# Patient Record
Sex: Female | Born: 1945 | Race: Black or African American | Hispanic: No | Smoking: Never smoker
Health system: Southern US, Community
[De-identification: ages and names within clinical notes are randomized; demographics above are authoritative.]

## PROBLEM LIST (undated history)

## (undated) DIAGNOSIS — F0390 Unspecified dementia without behavioral disturbance: Secondary | ICD-10-CM

## (undated) DIAGNOSIS — N179 Acute kidney failure, unspecified: Secondary | ICD-10-CM

## (undated) DIAGNOSIS — F03A0 Unspecified dementia, mild, without behavioral disturbance, psychotic disturbance, mood disturbance, and anxiety: Secondary | ICD-10-CM

## (undated) DIAGNOSIS — D3500 Benign neoplasm of unspecified adrenal gland: Secondary | ICD-10-CM

## (undated) DIAGNOSIS — I10 Essential (primary) hypertension: Secondary | ICD-10-CM

## (undated) DIAGNOSIS — N183 Chronic kidney disease, stage 3 unspecified: Secondary | ICD-10-CM

## (undated) DIAGNOSIS — G459 Transient cerebral ischemic attack, unspecified: Secondary | ICD-10-CM

## (undated) HISTORY — PX: CARPAL TUNNEL RELEASE: SHX101

---

## 2004-09-16 ENCOUNTER — Ambulatory Visit: Payer: Self-pay | Admitting: Specialist

## 2014-06-16 DIAGNOSIS — I10 Essential (primary) hypertension: Secondary | ICD-10-CM | POA: Diagnosis not present

## 2014-06-16 DIAGNOSIS — G459 Transient cerebral ischemic attack, unspecified: Secondary | ICD-10-CM | POA: Diagnosis not present

## 2014-06-16 DIAGNOSIS — I69998 Other sequelae following unspecified cerebrovascular disease: Secondary | ICD-10-CM | POA: Diagnosis not present

## 2014-06-16 DIAGNOSIS — E049 Nontoxic goiter, unspecified: Secondary | ICD-10-CM | POA: Diagnosis not present

## 2014-06-17 DIAGNOSIS — E784 Other hyperlipidemia: Secondary | ICD-10-CM | POA: Diagnosis not present

## 2014-06-17 DIAGNOSIS — I10 Essential (primary) hypertension: Secondary | ICD-10-CM | POA: Diagnosis not present

## 2014-06-26 DIAGNOSIS — E041 Nontoxic single thyroid nodule: Secondary | ICD-10-CM | POA: Diagnosis not present

## 2014-09-24 DIAGNOSIS — G459 Transient cerebral ischemic attack, unspecified: Secondary | ICD-10-CM | POA: Diagnosis not present

## 2014-09-24 DIAGNOSIS — E049 Nontoxic goiter, unspecified: Secondary | ICD-10-CM | POA: Diagnosis not present

## 2015-12-28 DIAGNOSIS — R946 Abnormal results of thyroid function studies: Secondary | ICD-10-CM | POA: Diagnosis not present

## 2015-12-28 DIAGNOSIS — Z1389 Encounter for screening for other disorder: Secondary | ICD-10-CM | POA: Diagnosis not present

## 2015-12-28 DIAGNOSIS — I10 Essential (primary) hypertension: Secondary | ICD-10-CM | POA: Diagnosis not present

## 2017-01-30 DIAGNOSIS — R413 Other amnesia: Secondary | ICD-10-CM | POA: Diagnosis not present

## 2017-01-30 DIAGNOSIS — R4189 Other symptoms and signs involving cognitive functions and awareness: Secondary | ICD-10-CM | POA: Diagnosis not present

## 2017-01-30 DIAGNOSIS — I10 Essential (primary) hypertension: Secondary | ICD-10-CM | POA: Diagnosis not present

## 2017-01-30 DIAGNOSIS — R946 Abnormal results of thyroid function studies: Secondary | ICD-10-CM | POA: Diagnosis not present

## 2017-01-30 DIAGNOSIS — Z1389 Encounter for screening for other disorder: Secondary | ICD-10-CM | POA: Diagnosis not present

## 2017-02-01 ENCOUNTER — Encounter: Payer: Self-pay | Admitting: Emergency Medicine

## 2017-02-01 ENCOUNTER — Emergency Department
Admission: EM | Admit: 2017-02-01 | Discharge: 2017-02-02 | Disposition: A | Discharge status: Home / Self Care | Payer: Medicare Other | Attending: Emergency Medicine | Admitting: Emergency Medicine

## 2017-02-01 DIAGNOSIS — M5410 Radiculopathy, site unspecified: Secondary | ICD-10-CM

## 2017-02-01 DIAGNOSIS — M5412 Radiculopathy, cervical region: Secondary | ICD-10-CM | POA: Diagnosis not present

## 2017-02-01 DIAGNOSIS — M25512 Pain in left shoulder: Secondary | ICD-10-CM | POA: Diagnosis present

## 2017-02-01 LAB — CBC
HCT: 40.4 % (ref 35.0–47.0)
Hemoglobin: 13.5 g/dL (ref 12.0–16.0)
MCH: 29.4 pg (ref 26.0–34.0)
MCHC: 33.6 g/dL (ref 32.0–36.0)
MCV: 87.5 fL (ref 80.0–100.0)
PLATELETS: 320 10*3/uL (ref 150–440)
RBC: 4.62 MIL/uL (ref 3.80–5.20)
RDW: 13.5 % (ref 11.5–14.5)
WBC: 5.5 10*3/uL (ref 3.6–11.0)

## 2017-02-01 LAB — COMPREHENSIVE METABOLIC PANEL
ALT: 11 U/L — ABNORMAL LOW (ref 14–54)
AST: 19 U/L (ref 15–41)
Albumin: 3.9 g/dL (ref 3.5–5.0)
Alkaline Phosphatase: 88 U/L (ref 38–126)
Anion gap: 6 (ref 5–15)
BUN: 15 mg/dL (ref 6–20)
CHLORIDE: 105 mmol/L (ref 101–111)
CO2: 29 mmol/L (ref 22–32)
Calcium: 9.3 mg/dL (ref 8.9–10.3)
Creatinine, Ser: 1.04 mg/dL — ABNORMAL HIGH (ref 0.44–1.00)
GFR, EST NON AFRICAN AMERICAN: 54 mL/min — AB (ref >60)
Glucose, Bld: 141 mg/dL — ABNORMAL HIGH (ref 65–99)
POTASSIUM: 4 mmol/L (ref 3.5–5.1)
Sodium: 140 mmol/L (ref 135–145)
Total Bilirubin: 0.6 mg/dL (ref 0.3–1.2)
Total Protein: 8 g/dL (ref 6.5–8.1)

## 2017-02-01 LAB — TROPONIN I

## 2017-02-01 NOTE — ED Triage Notes (Signed)
Pt in with co left shoulder pain states "all my life". Has never seen md for the same, states no known injury. Pain radiates up left neck and left ear. Worsening symptoms x 3 weeks.

## 2017-02-02 MED ORDER — HYDROCODONE-ACETAMINOPHEN 5-325 MG PO TABS: 1.0000 | ORAL_TABLET | ORAL | 0 refills | Status: DC | PRN

## 2017-02-02 MED ORDER — PREDNISONE 10 MG PO TABS: 10.0000 mg | ORAL_TABLET | Freq: Every day | ORAL | 0 refills | Status: DC

## 2017-02-02 MED ORDER — HYDROCODONE-ACETAMINOPHEN 5-325 MG PO TABS
2.0000 | ORAL_TABLET | Freq: Once | ORAL | Status: AC
  Administered 2017-02-02: 2 via ORAL
  Filled 2017-02-02: qty 2

## 2017-02-02 MED ORDER — PREDNISONE 20 MG PO TABS
60.0000 mg | ORAL_TABLET | Freq: Once | ORAL | Status: AC
  Administered 2017-02-02: 60 mg via ORAL
  Filled 2017-02-02: qty 3

## 2017-02-02 NOTE — ED Provider Notes (Signed)
[facility] Emergency Department Provider Note  Time seen: 12:15 AM  I have reviewed the triage vital signs and the nursing notes.   HISTORY  Chief Complaint Shoulder Pain    HPI Victoria Wilkerson is a 71 y.o. female with no pertinent past medical history presents to the emergency department for left shoulder pain. According to the patient for years she has been scratching left shoulder pain which she describes as a dull pain. Over the past one week intermittently she has been having sharp shooting pain down the left arm. States it is getting more frequent which is what prompted today's emergency department visit. Patient denies any chest pain. Denies any nausea, vomiting, diaphoresis, shortness of breath. Denies any weakness or numbness. Denies any headache. Does state mild neck pain but denies any trauma. Patient states there is no arm pain at times and then she will have significant sharp shooting pain which lasts one or 2 seconds and then dissipates.  No past medical history on file.  There are no active problems to display for this patient.   No past surgical history on file.  Prior to Admission medications   Not on File    No Known Allergies  No family history on file.  Social History Social History  Substance Use Topics  . Smoking status: Not on file  . Smokeless tobacco: Not on file  . Alcohol use Not on file    Review of Systems Constitutional: Negative for fever. Cardiovascular: Negative for chest pain. Respiratory: Negative for shortness of breath. Gastrointestinal: Negative for abdominal pain, vomiting  Musculoskeletal: left shoulder pain/left upper extremity pain Neurological: Negative for headaches, focal weakness or numbness. All other ROS negative  ____________________________________________   PHYSICAL EXAM:  VITAL SIGNS: ED Triage Vitals  Enc Vitals Group     BP 02/01/17 2127 (!) 150/83     Pulse Rate 02/01/17 2127 96   Resp 02/01/17 2127 20     Temp 02/01/17 2127 97.7 F (36.5 C)     Temp Source 02/01/17 2127 Oral     SpO2 02/01/17 2127 97 %     Weight 02/01/17 2128 183 lb (83 kg)     Height 02/01/17 2128 5' (1.524 m)     Head Circumference --      Peak Flow --      Pain Score 02/01/17 2126 9     Pain Loc --      Pain Edu? --      Excl. in [place]? --     Constitutional: Alert and oriented. Well appearing and in no distress. Eyes: Normal exam ENT   Head: Normocephalic and atraumatic   Mouth/Throat: Mucous membranes are moist. Cardiovascular: Normal rate, regular rhythm. No murmur Respiratory: Normal respiratory effort without tachypnea nor retractions. Breath sounds are clear  Gastrointestinal: Soft and nontender. No distention.  Musculoskeletal: nontender left shoulder with good range of motion in the left arm, neurovascular intact. No erythema or edema noted.no C, T or L-spine tenderness.on exam turning the patient's head to her right elicited the sharp pain shooting down her left upper extremity. Neurologic:  Normal speech and language. No gross focal neurologic deficits  Skin:  Skin is warm, dry and intact.  Psychiatric: Mood and affect are normal. Speech and behavior are normal.   ____________________________________________    EKG  EKG reviewed and interpreted by myself shows normal sinus rhythm at 82 bpm, narrow QRS, mild left axis deviation, normal intervals no concerning ST changes.  ____________________________________________  INITIAL IMPRESSION / ASSESSMENT AND PLAN / ED COURSE  Pertinent labs & imaging results that were available during my care of the patient were reviewed by me and considered in my medical decision making (see chart for details).  patient presents to the emergency department for intermittent sharp shooting pain down the left upper extremity. Differential this time would include radicular/neuropathic pain, neuropathy, less likely ACS. Patient has no  tenderness in the left shoulder with good range of motion, do not suspect fracture. Patient's pain was elicited when she turns her head to the right, highly suspect radicular pain/cervical radiculopathy. We will place the patient on a course of prednisone as well as Vicodin for short-term pain relief. Patient will follow-up with orthopedics if not improved in the next several days. Patient and family agreeable to plan. Attempted to review the patient's records and no pertinent medical history was identified.  ____________________________________________   FINAL CLINICAL IMPRESSION(S) / ED DIAGNOSES  cervical radiculopathy    Harvest Dark, MD 02/02/17 0021

## 2017-02-02 NOTE — Discharge Instructions (Signed)
please take your medications as prescribed. Please follow-up with your primary care doctor in the next 2 days for recheck/reevaluation. Return to the emergency department for any worsening pain, any chest pain, any weakness, numbness, or any other symptom personally concerning to yourself. If you're not improved in the next 2-3 days please call the number provided for orthopedics to arrange a follow-up appointment.

## 2017-03-14 DIAGNOSIS — Z9189 Other specified personal risk factors, not elsewhere classified: Secondary | ICD-10-CM | POA: Diagnosis not present

## 2017-03-14 DIAGNOSIS — F0280 Dementia in other diseases classified elsewhere without behavioral disturbance: Secondary | ICD-10-CM | POA: Diagnosis not present

## 2017-03-14 DIAGNOSIS — F0390 Unspecified dementia without behavioral disturbance: Secondary | ICD-10-CM | POA: Diagnosis not present

## 2017-03-14 DIAGNOSIS — R413 Other amnesia: Secondary | ICD-10-CM | POA: Diagnosis not present

## 2017-03-14 DIAGNOSIS — R7309 Other abnormal glucose: Secondary | ICD-10-CM | POA: Diagnosis not present

## 2017-03-14 DIAGNOSIS — G309 Alzheimer's disease, unspecified: Secondary | ICD-10-CM | POA: Diagnosis not present

## 2017-03-21 ENCOUNTER — Other Ambulatory Visit: Payer: Self-pay | Admitting: Neurology

## 2017-03-21 DIAGNOSIS — I639 Cerebral infarction, unspecified: Secondary | ICD-10-CM

## 2017-03-30 ENCOUNTER — Ambulatory Visit
Admission: RE | Admit: 2017-03-30 | Discharge: 2017-03-30 | Disposition: A | Discharge status: Home / Self Care | Payer: Medicare Other | Source: Ambulatory Visit | Attending: Neurology | Admitting: Neurology

## 2017-03-30 DIAGNOSIS — R413 Other amnesia: Secondary | ICD-10-CM | POA: Diagnosis not present

## 2017-03-30 DIAGNOSIS — I639 Cerebral infarction, unspecified: Secondary | ICD-10-CM

## 2017-04-17 DIAGNOSIS — G301 Alzheimer's disease with late onset: Secondary | ICD-10-CM | POA: Diagnosis not present

## 2017-04-17 DIAGNOSIS — F0280 Dementia in other diseases classified elsewhere without behavioral disturbance: Secondary | ICD-10-CM | POA: Diagnosis not present

## 2017-04-17 DIAGNOSIS — G459 Transient cerebral ischemic attack, unspecified: Secondary | ICD-10-CM | POA: Diagnosis not present

## 2018-10-22 IMAGING — MR MR HEAD W/O CM
10 series · 48 of 48 positions shown · non-contrast
Comparison: None.

CLINICAL DATA: Memory loss for 2-3 weeks.  Evaluate for stroke.

EXAM:
MRI HEAD WITHOUT CONTRAST
TECHNIQUE: Multiplanar, multiecho pulse sequences of the brain and surrounding
structures were obtained without intravenous contrast.

[Series 2: T1 · sagittal · 5.0mm · 0.45mm/px · 3 of 23 slices shown (1 of 2)]
[im 1/23]
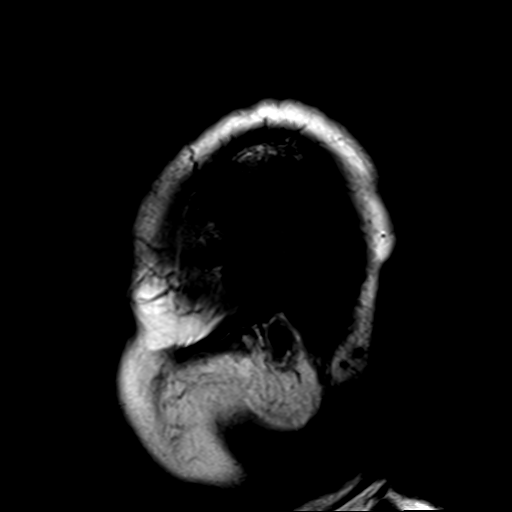
[im 12/23]
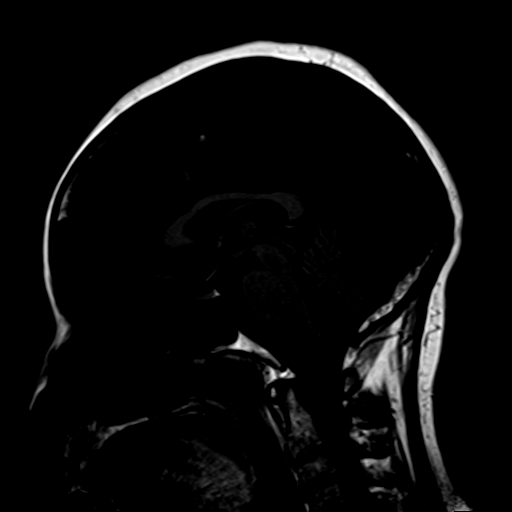
[im 23/23]
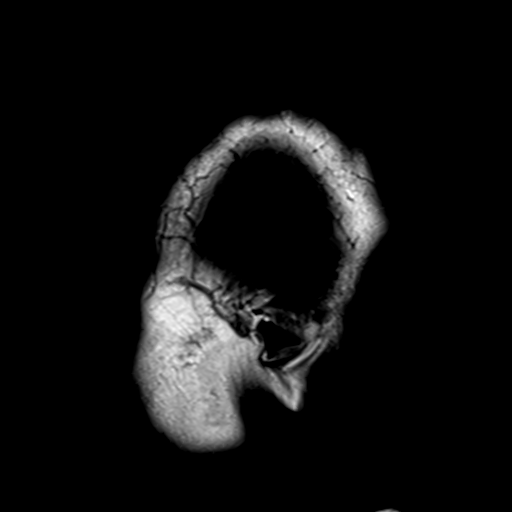

[Series 4: DWI · axial · 3.0mm · 1.80mm/px · z∈[-104,+56]mm · 5 of 55 slices shown (1 of 2)]
[im 1/55]
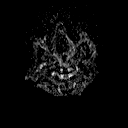
[im 14/55]
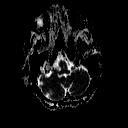
[im 28/55]
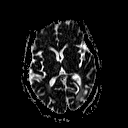
[im 41/55]
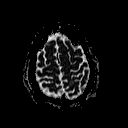
[im 55/55]
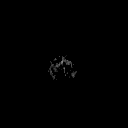

[Series 6: DWI · coronal · 3.0mm · 1.80mm/px · 4 of 45 slices shown (2 of 2)]
[im 1/45]
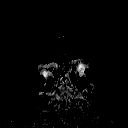
[im 15/45]
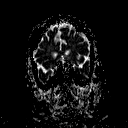
[im 30/45]
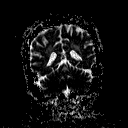
[im 45/45]
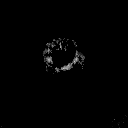

[Series 7: T2 · axial · 5.0mm · 0.60mm/px · z∈[-100,+53]mm · 2 of 25 slices shown (1 of 3)]
[im 1/25]
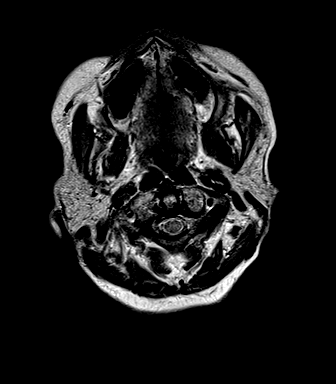
[im 25/25]
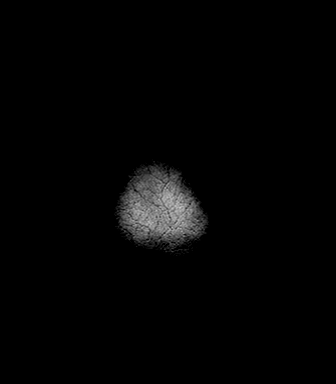

[Series 8: FLAIR · axial · 3.0mm · 0.45mm/px · z∈[-100,+53]mm · 5 of 53 slices shown]
[im 1/53]
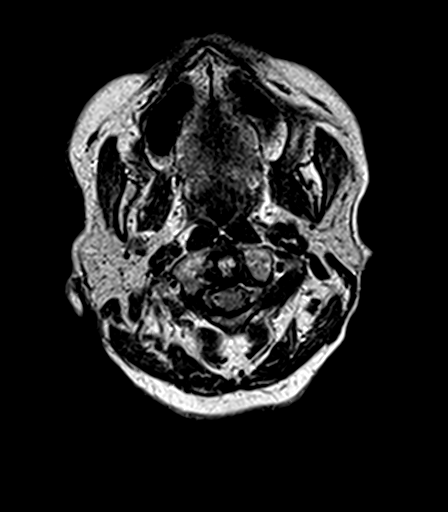
[im 14/53]
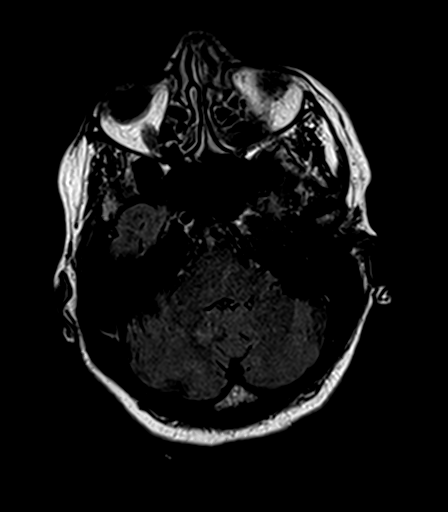
[im 27/53]
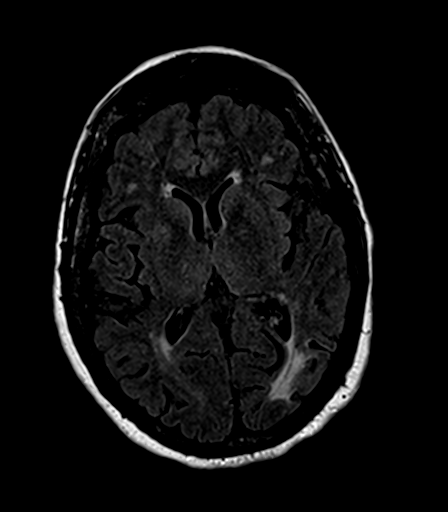
[im 40/53]
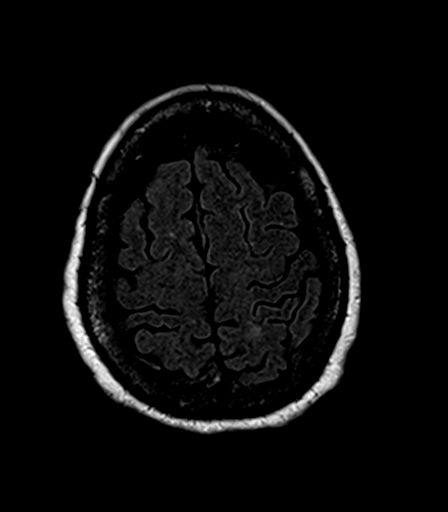
[im 53/53]
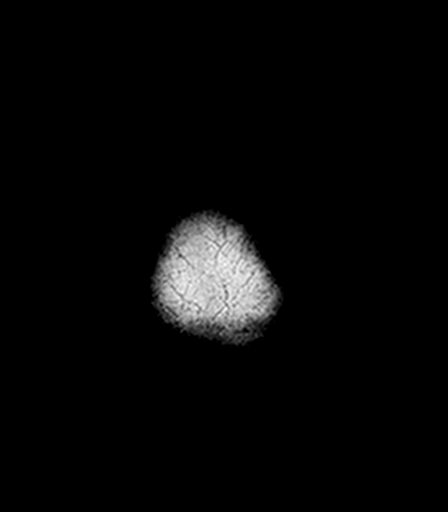

[Series 9: T2 · axial · 5.0mm · 0.45mm/px · z∈[-100,+53]mm · 2 of 25 slices shown (2 of 3)]
[im 1/25]
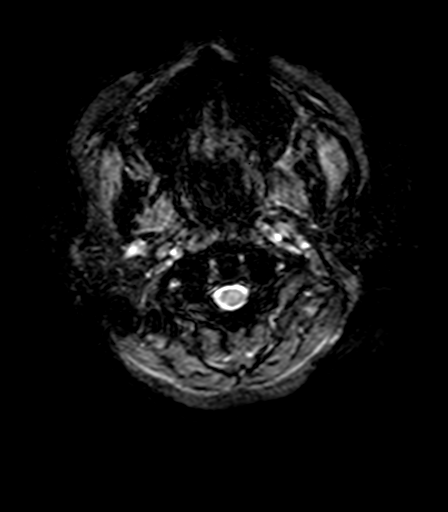
[im 25/25]
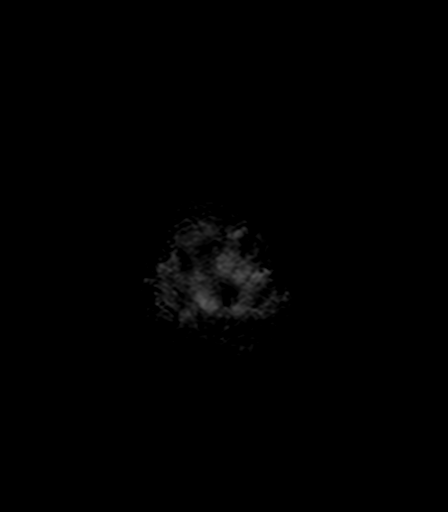

[Series 10: T1 · axial · 1.0mm · 1.00mm/px · z∈[-112,+61]mm · 16 of 176 slices shown (2 of 2)]
[im 1/176]
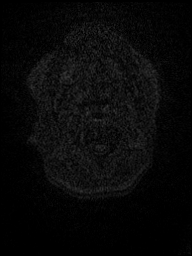
[im 12/176]
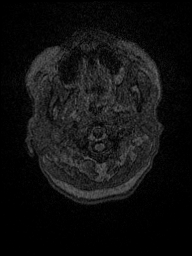
[im 24/176]
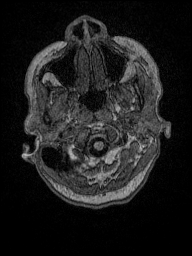
[im 36/176]
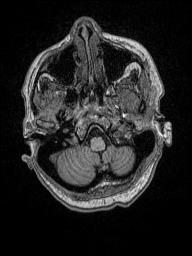
[im 47/176]
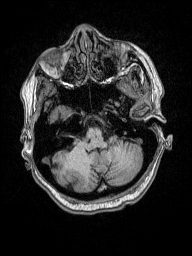
[im 59/176]
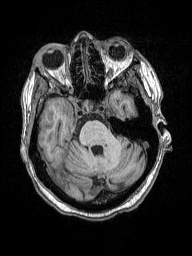
[im 71/176]
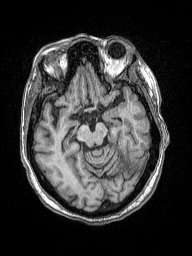
[im 82/176]
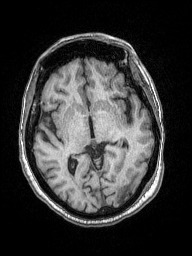
[im 94/176]
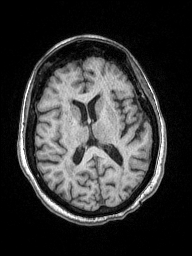
[im 106/176]
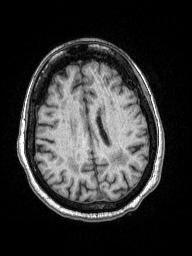
[im 117/176]
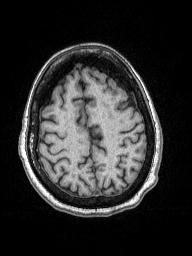
[im 129/176]
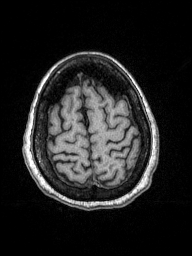
[im 141/176]
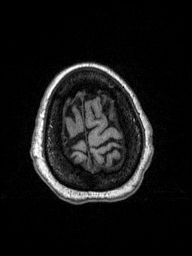
[im 152/176]
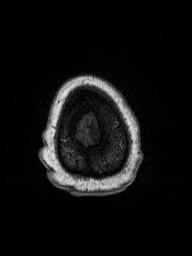
[im 164/176]
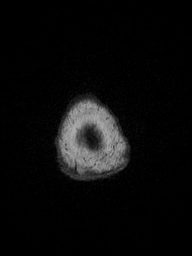
[im 176/176]
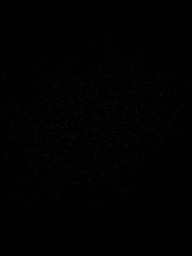

[Series 11: T2 · coronal · 5.0mm · 0.49mm/px · 2 of 27 slices shown (3 of 3)]
[im 1/27]
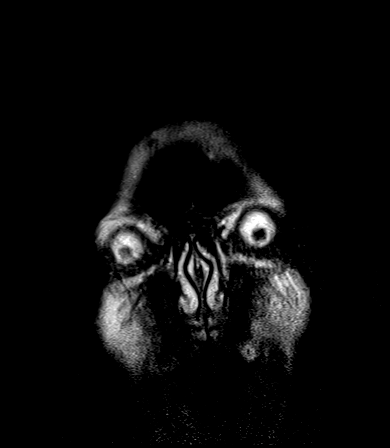
[im 27/27]
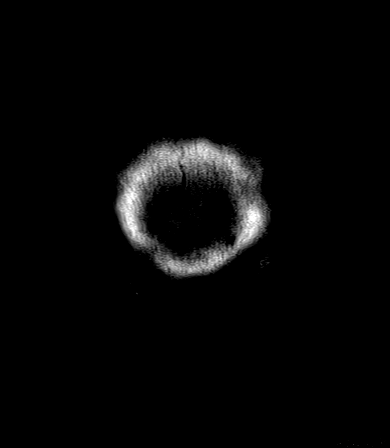

[Series 100: ax (id) · axial · 3.0mm · 1.80mm/px · z∈[-104,+56]mm · 5 of 55 slices shown]
[im 1/55]
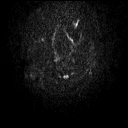
[im 14/55]
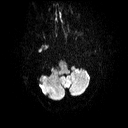
[im 28/55]
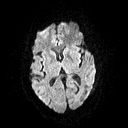
[im 41/55]
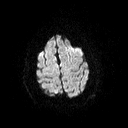
[im 55/55]
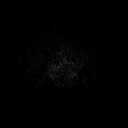

[Series 101: cor (id) · coronal · 3.0mm · 1.80mm/px · 4 of 44 slices shown]
[im 1/44]
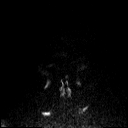
[im 15/44]
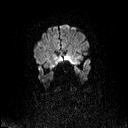
[im 29/44]
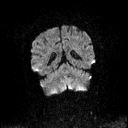
[im 44/44]
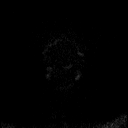

[48 of 48 positions shown; findings below may reference images not displayed]

FINDINGS: Brain: No acute infarction, hemorrhage, hydrocephalus, extra-axial
collection or mass lesion. Small remote left occipital cortex
infarct. Tiny right occipital cortex infarct. Chronic small vessel
ischemic type gliosis in the cerebral white matter. Normal brain
volume.

Vascular: Major flow voids are preserved

Skull and upper cervical spine: Negative for marrow lesion

Sinuses/Orbits: Negative
IMPRESSION: 1. No acute finding including infarct.
2. No specific or reversible explanation for memory loss.
3. Mild to moderate chronic small vessel ischemic change in the
cerebral white matter. Small remote occipital infarcts.

## 2019-09-23 ENCOUNTER — Other Ambulatory Visit: Payer: Self-pay

## 2019-09-23 ENCOUNTER — Emergency Department: Payer: Medicare Other

## 2019-09-23 ENCOUNTER — Inpatient Hospital Stay
Admission: EM | Admit: 2019-09-23 | Discharge: 2019-09-27 | DRG: 871 | Disposition: A | Discharge status: Home Health | Payer: Medicare Other | Attending: Internal Medicine | Admitting: Internal Medicine

## 2019-09-23 DIAGNOSIS — F03A0 Unspecified dementia, mild, without behavioral disturbance, psychotic disturbance, mood disturbance, and anxiety: Secondary | ICD-10-CM | POA: Insufficient documentation

## 2019-09-23 DIAGNOSIS — Z8673 Personal history of transient ischemic attack (TIA), and cerebral infarction without residual deficits: Secondary | ICD-10-CM

## 2019-09-23 DIAGNOSIS — I10 Essential (primary) hypertension: Secondary | ICD-10-CM | POA: Diagnosis present

## 2019-09-23 DIAGNOSIS — A419 Sepsis, unspecified organism: Secondary | ICD-10-CM

## 2019-09-23 DIAGNOSIS — D3500 Benign neoplasm of unspecified adrenal gland: Secondary | ICD-10-CM | POA: Diagnosis present

## 2019-09-23 DIAGNOSIS — I129 Hypertensive chronic kidney disease with stage 1 through stage 4 chronic kidney disease, or unspecified chronic kidney disease: Secondary | ICD-10-CM | POA: Diagnosis present

## 2019-09-23 DIAGNOSIS — N3000 Acute cystitis without hematuria: Secondary | ICD-10-CM | POA: Diagnosis not present

## 2019-09-23 DIAGNOSIS — F03918 Unspecified dementia, unspecified severity, with other behavioral disturbance: Secondary | ICD-10-CM | POA: Insufficient documentation

## 2019-09-23 DIAGNOSIS — G9340 Encephalopathy, unspecified: Secondary | ICD-10-CM

## 2019-09-23 DIAGNOSIS — N1831 Chronic kidney disease, stage 3a: Secondary | ICD-10-CM

## 2019-09-23 DIAGNOSIS — G9349 Other encephalopathy: Secondary | ICD-10-CM | POA: Diagnosis not present

## 2019-09-23 DIAGNOSIS — A4189 Other specified sepsis: Principal | ICD-10-CM | POA: Diagnosis present

## 2019-09-23 DIAGNOSIS — F0390 Unspecified dementia without behavioral disturbance: Secondary | ICD-10-CM | POA: Diagnosis present

## 2019-09-23 DIAGNOSIS — Z23 Encounter for immunization: Secondary | ICD-10-CM

## 2019-09-23 DIAGNOSIS — N390 Urinary tract infection, site not specified: Secondary | ICD-10-CM | POA: Diagnosis present

## 2019-09-23 DIAGNOSIS — E876 Hypokalemia: Secondary | ICD-10-CM | POA: Diagnosis present

## 2019-09-23 DIAGNOSIS — J189 Pneumonia, unspecified organism: Secondary | ICD-10-CM

## 2019-09-23 DIAGNOSIS — U071 COVID-19: Secondary | ICD-10-CM | POA: Diagnosis present

## 2019-09-23 DIAGNOSIS — J1282 Pneumonia due to coronavirus disease 2019: Secondary | ICD-10-CM | POA: Diagnosis present

## 2019-09-23 DIAGNOSIS — N179 Acute kidney failure, unspecified: Secondary | ICD-10-CM

## 2019-09-23 DIAGNOSIS — Z79899 Other long term (current) drug therapy: Secondary | ICD-10-CM

## 2019-09-23 DIAGNOSIS — D3501 Benign neoplasm of right adrenal gland: Secondary | ICD-10-CM | POA: Diagnosis not present

## 2019-09-23 DIAGNOSIS — E860 Dehydration: Secondary | ICD-10-CM | POA: Diagnosis present

## 2019-09-23 HISTORY — DX: Transient cerebral ischemic attack, unspecified: G45.9

## 2019-09-23 HISTORY — DX: Unspecified dementia without behavioral disturbance: F03.90

## 2019-09-23 HISTORY — DX: Chronic kidney disease, stage 3 unspecified: N18.30

## 2019-09-23 HISTORY — DX: Essential (primary) hypertension: I10

## 2019-09-23 HISTORY — DX: Pneumonia due to coronavirus disease 2019: J12.82

## 2019-09-23 HISTORY — DX: Unspecified dementia, mild, without behavioral disturbance, psychotic disturbance, mood disturbance, and anxiety: F03.A0

## 2019-09-23 LAB — URINALYSIS, COMPLETE (UACMP) WITH MICROSCOPIC
Bacteria, UA: NONE SEEN
Bilirubin Urine: NEGATIVE
Glucose, UA: NEGATIVE mg/dL
Ketones, ur: NEGATIVE mg/dL
Nitrite: NEGATIVE
Protein, ur: 30 mg/dL — AB
Specific Gravity, Urine: 1.042 — ABNORMAL HIGH (ref 1.005–1.030)
WBC, UA: >50 WBC/hpf — ABNORMAL HIGH (ref 0–5)
pH: 5 (ref 5.0–8.0)

## 2019-09-23 LAB — COMPREHENSIVE METABOLIC PANEL
ALT: 24 U/L (ref 0–44)
AST: 40 U/L (ref 15–41)
Albumin: 3.9 g/dL (ref 3.5–5.0)
Alkaline Phosphatase: 66 U/L (ref 38–126)
Anion gap: 10 (ref 5–15)
BUN: 47 mg/dL — ABNORMAL HIGH (ref 8–23)
CO2: 25 mmol/L (ref 22–32)
Calcium: 8.6 mg/dL — ABNORMAL LOW (ref 8.9–10.3)
Chloride: 102 mmol/L (ref 98–111)
Creatinine, Ser: 1.79 mg/dL — ABNORMAL HIGH (ref 0.44–1.00)
GFR calc Af Amer: 32 mL/min — ABNORMAL LOW (ref >60)
GFR calc non Af Amer: 28 mL/min — ABNORMAL LOW (ref >60)
Glucose, Bld: 107 mg/dL — ABNORMAL HIGH (ref 70–99)
Potassium: 3.5 mmol/L (ref 3.5–5.1)
Sodium: 137 mmol/L (ref 135–145)
Total Bilirubin: 0.8 mg/dL (ref 0.3–1.2)
Total Protein: 8.7 g/dL — ABNORMAL HIGH (ref 6.5–8.1)

## 2019-09-23 LAB — TRIGLYCERIDES: Triglycerides: 93 mg/dL (ref <150)

## 2019-09-23 LAB — FIBRINOGEN: Fibrinogen: 475 mg/dL (ref 210–475)

## 2019-09-23 LAB — TROPONIN I (HIGH SENSITIVITY): Troponin I (High Sensitivity): 13 ng/L (ref <18)

## 2019-09-23 LAB — POC SARS CORONAVIRUS 2 AG: SARS Coronavirus 2 Ag: POSITIVE — AB

## 2019-09-23 LAB — PROCALCITONIN: Procalcitonin: <0.1 ng/mL

## 2019-09-23 LAB — LACTIC ACID, PLASMA
Lactic Acid, Venous: 0.9 mmol/L (ref 0.5–1.9)
Lactic Acid, Venous: 2 mmol/L (ref 0.5–1.9)

## 2019-09-23 LAB — CBC
HCT: 42 % (ref 36.0–46.0)
Hemoglobin: 13.5 g/dL (ref 12.0–15.0)
MCH: 28.5 pg (ref 26.0–34.0)
MCHC: 32.1 g/dL (ref 30.0–36.0)
MCV: 88.8 fL (ref 80.0–100.0)
Platelets: 220 10*3/uL (ref 150–400)
RBC: 4.73 MIL/uL (ref 3.87–5.11)
RDW: 13 % (ref 11.5–15.5)
WBC: 4.1 10*3/uL (ref 4.0–10.5)
nRBC: 0 % (ref 0.0–0.2)

## 2019-09-23 LAB — SARS CORONAVIRUS 2 BY RT PCR (HOSPITAL ORDER, PERFORMED IN ~~LOC~~ HOSPITAL LAB): SARS Coronavirus 2: POSITIVE — AB

## 2019-09-23 LAB — BRAIN NATRIURETIC PEPTIDE: B Natriuretic Peptide: 20.8 pg/mL (ref 0.0–100.0)

## 2019-09-23 LAB — FERRITIN: Ferritin: 417 ng/mL — ABNORMAL HIGH (ref 11–307)

## 2019-09-23 LAB — LACTATE DEHYDROGENASE: LDH: 216 U/L — ABNORMAL HIGH (ref 98–192)

## 2019-09-23 LAB — LIPASE, BLOOD: Lipase: 48 U/L (ref 11–51)

## 2019-09-23 LAB — FIBRIN DERIVATIVES D-DIMER (ARMC ONLY): Fibrin derivatives D-dimer (ARMC): 2691.95 ng/mL (FEU) — ABNORMAL HIGH (ref 0.00–499.00)

## 2019-09-23 MED ORDER — SODIUM CHLORIDE 0.9 % IV SOLN: INTRAVENOUS | Status: DC

## 2019-09-23 MED ORDER — ALBUTEROL SULFATE HFA 108 (90 BASE) MCG/ACT IN AERS
2.0000 | INHALATION_SPRAY | RESPIRATORY_TRACT | Status: DC | PRN
  Filled 2019-09-23: qty 6.7

## 2019-09-23 MED ORDER — ENOXAPARIN SODIUM 40 MG/0.4ML ~~LOC~~ SOLN
30.0000 mg | SUBCUTANEOUS | Status: DC
  Administered 2019-09-23: 30 mg via SUBCUTANEOUS
  Filled 2019-09-23: qty 0.4

## 2019-09-23 MED ORDER — HYDRALAZINE HCL 20 MG/ML IJ SOLN: 5.0000 mg | INTRAMUSCULAR | Status: DC | PRN

## 2019-09-23 MED ORDER — SODIUM CHLORIDE 0.9 % IV SOLN
2.0000 g | INTRAVENOUS | Status: DC
  Administered 2019-09-24 – 2019-09-26 (×3): 2 g via INTRAVENOUS
  Filled 2019-09-23 (×2): qty 20
  Filled 2019-09-23 (×2): qty 2

## 2019-09-23 MED ORDER — DM-GUAIFENESIN ER 30-600 MG PO TB12: 1.0000 | ORAL_TABLET | Freq: Two times a day (BID) | ORAL | Status: DC | PRN

## 2019-09-23 MED ORDER — ACETAMINOPHEN 325 MG PO TABS
650.0000 mg | ORAL_TABLET | Freq: Four times a day (QID) | ORAL | Status: DC | PRN
  Filled 2019-09-23: qty 2

## 2019-09-23 MED ORDER — ENOXAPARIN SODIUM 40 MG/0.4ML ~~LOC~~ SOLN: 40.0000 mg | SUBCUTANEOUS | Status: DC

## 2019-09-23 MED ORDER — SODIUM CHLORIDE 0.9 % IV SOLN
2.0000 g | Freq: Once | INTRAVENOUS | Status: AC
  Administered 2019-09-23: 2 g via INTRAVENOUS
  Filled 2019-09-23: qty 20

## 2019-09-23 MED ORDER — SODIUM CHLORIDE 0.9 % IV SOLN
100.0000 mg | Freq: Every day | INTRAVENOUS | Status: AC
  Administered 2019-09-24 – 2019-09-27 (×4): 100 mg via INTRAVENOUS
  Filled 2019-09-23: qty 20
  Filled 2019-09-23: qty 100
  Filled 2019-09-23: qty 20
  Filled 2019-09-23: qty 100

## 2019-09-23 MED ORDER — SODIUM CHLORIDE 0.9 % IV SOLN: Freq: Once | INTRAVENOUS | Status: DC

## 2019-09-23 MED ORDER — ONDANSETRON HCL 4 MG/2ML IJ SOLN
4.0000 mg | Freq: Three times a day (TID) | INTRAMUSCULAR | Status: DC | PRN
  Administered 2019-09-23: 4 mg via INTRAVENOUS
  Filled 2019-09-23: qty 2

## 2019-09-23 MED ORDER — SODIUM CHLORIDE 0.9 % IV SOLN
200.0000 mg | Freq: Once | INTRAVENOUS | Status: AC
  Administered 2019-09-23: 200 mg via INTRAVENOUS
  Filled 2019-09-23: qty 40

## 2019-09-23 MED ORDER — IOHEXOL 300 MG/ML  SOLN
75.0000 mL | Freq: Once | INTRAMUSCULAR | Status: AC | PRN
  Administered 2019-09-23: 75 mL via INTRAVENOUS

## 2019-09-23 MED ORDER — ASCORBIC ACID 500 MG PO TABS
500.0000 mg | ORAL_TABLET | Freq: Every day | ORAL | Status: DC
  Administered 2019-09-23 – 2019-09-27 (×5): 500 mg via ORAL
  Filled 2019-09-23 (×5): qty 1

## 2019-09-23 MED ORDER — ZINC SULFATE 220 (50 ZN) MG PO CAPS
220.0000 mg | ORAL_CAPSULE | Freq: Every day | ORAL | Status: DC
  Administered 2019-09-23 – 2019-09-27 (×5): 220 mg via ORAL
  Filled 2019-09-23 (×5): qty 1

## 2019-09-23 MED ORDER — SODIUM CHLORIDE 0.9 % IV SOLN
500.0000 mg | Freq: Once | INTRAVENOUS | Status: AC
  Administered 2019-09-23: 500 mg via INTRAVENOUS
  Filled 2019-09-23: qty 500

## 2019-09-23 NOTE — H&P (Signed)
History and Physical    Terrace Speights [id number] DOB: 1945/08/23 DOA: 09/23/2019  Referring MD/NP/PA:   PCP: [facility] [place]   Patient coming from:  The patient is coming from home.  At baseline, pt is partially dependent for most of ADL.        Chief Complaint: cough, generalized weakness, fever, nausea, vomiting and abdominal pain  HPI: Victoria Wilkerson is a 74 y.o. female with medical history significant of hypertension, TIA, CKD-3, mild dementia, who presents with cough, generalized weakness, fever, nausea, vomiting and abdominal pain.  Per her daughter (I called her daughter by phone), patient has been increasingly weak in the past several days.  Patient has cough, fever and chills. Patient denies chest pain.  No respiratory distress.  She also has nausea, vomiting and some mild abdominal pain.  No diarrhea.  Not sure if patient has symptoms of UTI.  She moves all extremities.  Patient was treated for UTI approximately 4 weeks ago per her daughter.  ED Course: pt was found to have WBC 4.1, lipase 48, positive Covid Ag test, positive urinalysis (cloudy appearance, large amount of leukocyte, negative bacteria, WBC >50), worsening renal function, temperature 100.7, blood pressure 131/70, heart rate 90s, oxygen saturation 95% on room air.  Chest x-ray negative.  CT abdomen/pelvis showed multifocal pneumonia and 1.6 cm right adrenal adenoma.  Patient is placed on MedSurg bed for observation  Review of Systems: Could not be reviewed accurately due to dementia   Allergy: No Known Allergies  Past Medical History:  Diagnosis Date  . CKD (chronic kidney disease) stage 3, GFR 30-59 ml/min   . Hypertension   . Mild dementia ([place])   . TIA (transient ischemic attack)     Past Surgical History:  Procedure Laterality Date  . CARPAL TUNNEL RELEASE      Social History:  reports that she has never smoked. She has never used smokeless tobacco. She reports that she does not  drink alcohol or use drugs.  Family History: History reviewed. No pertinent family history.   Prior to Admission medications   Medication Sig Start Date End Date Taking? Authorizing Provider  hydrochlorothiazide (HYDRODIURIL) 12.5 MG tablet Take 12.5 mg by mouth daily.   Yes [provider]  losartan (COZAAR) 25 MG tablet Take 25 mg by mouth daily.   Yes [provider]    Physical Exam: Vitals:   09/23/19 1215 09/23/19 1330 09/23/19 1400 09/23/19 1430  BP:  124/72 127/69 124/72  Pulse:  95  99  Resp: 18 20 19  (!) 22  Temp:      TempSrc:      SpO2:  99%  99%  Weight:      Height:       General: Not in acute distress HEENT:       Eyes: PERRL, EOMI, no scleral icterus.       ENT: No discharge from the ears and nose, no pharynx injection, no tonsillar enlargement.        Neck: No JVD, no bruit, no mass felt. Heme: No neck lymph node enlargement. Cardiac: S1/S2, RRR, No murmurs, No gallops or rubs. Respiratory: No rales, wheezing, rhonchi or rubs. GI: Soft, nondistended, nontender, no rebound pain, no organomegaly, BS present. GU: No hematuria Ext: No pitting leg edema bilaterally. 1+DP/PT pulse bilaterally. Musculoskeletal: No joint deformities, No joint redness or warmth, no limitation of ROM in spin. Skin: No rashes.  Neuro: Alert, knows her own name, not orientated to time and  place, cranial nerves II-XII grossly intact, moves all extremities normally Psych: Patient is not psychotic, no suicidal or hemocidal ideation.  Labs on Admission: I have personally reviewed following labs and imaging studies  CBC: Recent Labs  Lab 09/23/19 0921  WBC 4.1  HGB 13.5  HCT 42.0  MCV 88.8  PLT [id number]   Basic Metabolic Panel: Recent Labs  Lab 09/23/19 0921  NA 137  K 3.5  CL 102  CO2 25  GLUCOSE 107*  BUN 47*  CREATININE 1.79*  CALCIUM 8.6*   GFR: Estimated Creatinine Clearance: 26.3 mL/min (A) (by C-G formula based on SCr of 1.79 mg/dL (H)). Liver  Function Tests: Recent Labs  Lab 09/23/19 0921  AST 40  ALT 24  ALKPHOS 66  BILITOT 0.8  PROT 8.7*  ALBUMIN 3.9   Recent Labs  Lab 09/23/19 0921  LIPASE 48   No results for input(s): AMMONIA in the last 168 hours. Coagulation Profile: No results for input(s): INR, PROTIME in the last 168 hours. Cardiac Enzymes: No results for input(s): CKTOTAL, CKMB, CKMBINDEX, TROPONINI in the last 168 hours. BNP (last 3 results) No results for input(s): PROBNP in the last 8760 hours. HbA1C: No results for input(s): HGBA1C in the last 72 hours. CBG: No results for input(s): GLUCAP in the last 168 hours. Lipid Profile: No results for input(s): CHOL, HDL, LDLCALC, TRIG, CHOLHDL, LDLDIRECT in the last 72 hours. Thyroid Function Tests: No results for input(s): TSH, T4TOTAL, FREET4, T3FREE, THYROIDAB in the last 72 hours. Anemia Panel: No results for input(s): VITAMINB12, FOLATE, FERRITIN, TIBC, IRON, RETICCTPCT in the last 72 hours. Urine analysis:    Component Value Date/Time   COLORURINE YELLOW (A) 09/23/2019 1142   APPEARANCEUR CLOUDY (A) 09/23/2019 1142   LABSPEC 1.042 (H) 09/23/2019 1142   PHURINE 5.0 09/23/2019 1142   GLUCOSEU NEGATIVE 09/23/2019 1142   HGBUR SMALL (A) 09/23/2019 1142   BILIRUBINUR NEGATIVE 09/23/2019 1142   [place] 09/23/2019 1142   PROTEINUR 30 (A) 09/23/2019 1142   NITRITE NEGATIVE 09/23/2019 1142   LEUKOCYTESUR LARGE (A) 09/23/2019 1142   Sepsis Labs: @LABRCNTIP (procalcitonin:4,lacticidven:4) ) Recent Results (from the past 240 hour(s))  SARS Coronavirus 2 by RT PCR (hospital order, performed in [place] hospital lab) Nasopharyngeal Nasopharyngeal Swab     Status: Abnormal   Collection Time: 09/23/19 12:42 PM   Specimen: Nasopharyngeal Swab  Result Value Ref Range Status   SARS Coronavirus 2 POSITIVE (A) NEGATIVE Final    Comment: RESULT CALLED TO, READ BACK BY AND VERIFIED WITH: DEE MCCLAIN RN AT [id number] ON 09/23/19 SNG (NOTE) SARS-CoV-2  target nucleic acids are DETECTED SARS-CoV-2 RNA is generally detectable in upper respiratory specimens  during the acute phase of infection.  Positive results are indicative  of the presence of the identified virus, but do not rule out bacterial infection or co-infection with other pathogens not detected by the test.  Clinical correlation with patient history and  other diagnostic information is necessary to determine patient infection status.  The expected result is negative. Fact Sheet for Patients:   StrictlyIdeas.no  Fact Sheet for Healthcare Providers:   BankingDealers.co.za   This test is not yet approved or cleared by the [place] FDA and  has been authorized for detection and/or diagnosis of SARS-CoV-2 by FDA under an Emergency Use Authorization (EUA).  This EUA will remain in effect (meaning this test ca n be used) for the duration of  the COVID-19 declaration under Section 564(b)(1) of the Act, 21 U.S.C. section  360-bbb-3(b)(1), unless the authorization is terminated or revoked sooner. Performed at [facility], [address] [place], [place] [address]      Radiological Exams on Admission: CT ABDOMEN PELVIS W CONTRAST  Result Date: 09/23/2019 CLINICAL DATA:  Nausea, vomiting. EXAM: CT ABDOMEN AND PELVIS WITH CONTRAST TECHNIQUE: Multidetector CT imaging of the abdomen and pelvis was performed using the standard protocol following bolus administration of intravenous contrast. CONTRAST:  85mL OMNIPAQUE IOHEXOL 300 MG/ML  SOLN COMPARISON:  None. FINDINGS: Lower chest: Multifocal airspace opacities are noted in the visualized lung bases concerning for multifocal pneumonia, potentially of viral etiology. Hepatobiliary: No focal liver abnormality is seen. Status post cholecystectomy. No biliary dilatation. Pancreas: Unremarkable. No pancreatic ductal dilatation or surrounding inflammatory changes. Spleen: Normal in size  without focal abnormality. Adrenals/Urinary Tract: 1.6 cm right adrenal adenoma is noted. Left adrenal gland appears normal. No hydronephrosis or renal obstruction is noted. No renal or ureteral calculi are noted. Urinary bladder is unremarkable. Stomach/Bowel: Stomach is within normal limits. Appendix appears normal. No evidence of bowel wall thickening, distention, or inflammatory changes. Vascular/Lymphatic: No significant vascular findings are present. No enlarged abdominal or pelvic lymph nodes. Reproductive: Uterus and bilateral adnexa are unremarkable. Other: No abdominal wall hernia or abnormality. No abdominopelvic ascites. Musculoskeletal: No acute or significant osseous findings. IMPRESSION: 1. Multiple airspace opacities are noted in the visualized lung bases concerning for multifocal pneumonia, potentially of viral etiology. 2. 1.6 cm right adrenal adenoma. 3. No other abnormality seen in the abdomen or pelvis. Electronically Signed   By: Marijo Conception M.D.   On: 09/23/2019 10:47   DG Chest Portable 1 View  Result Date: 09/23/2019 CLINICAL DATA:  Fever and weakness. Patient c/o N/V/D and abdominal pain since yesterday.Hx of TIA, htn, non smoker EXAM: PORTABLE CHEST 1 VIEW COMPARISON:  None. FINDINGS: The heart size and mediastinal contours are within normal limits. Both lungs are clear. No pleural effusion or pneumothorax. The visualized skeletal structures are grossly intact. IMPRESSION: No active disease. Electronically Signed   By: Lajean Manes M.D.   On: 09/23/2019 10:07     EKG: Independently reviewed.  Sinus rhythm, QTC 415, poor R wave progression, nonspecific to change  Assessment/Plan Principal Problem:   Pneumonia due to COVID-19 virus Active Problems:   Hypertension   Sepsis ([place])   UTI (urinary tract infection)   Adrenal adenoma-right   Acute renal failure superimposed on stage 3a chronic kidney disease ([place])   Pneumonia due to COVID-19 virus: Patient has cough, no  shortness of breath or chest pain, no oxygen desaturation, but CT scan showed multifocal infiltration.  Patient may deteriorate.  -will place on med-surg bed for obs -Remdesivir per pharm -vitamin C, zinc.  -prn albuterol inhaler -PRN Mucinex for cough -f/u Blood culture -Gentle IV fluid -D-dimer, BNP,Trop, LFT, CRP, LDH, Procalcitonin, Ferritin, fibinogen, TG, Hep B SAg, HIV ab -Daily CRP, Ferritin, D-dimer, -if has oxygen desaturation, will ask the patient to maintain an awake prone position for 16+ hours a day, if possible, with a minimum of 2-3 hours at a time -Will attempt to maintain euvolemia to a net negative fluid status -IF patient deteriorates, will consult PCCM and ID  UTI (urinary tract infection): -rocephin -f/u urine culture  Sepsis due to UTI and pneumonia secondary to COVID-19 infection: Patient meets critical for sepsis with fever, tachycardia.  Pending lactic acid level.  Currently hemodynamically stable -will get Procalcitonin and trend lactic acid levels per sepsis protocol. -IVF: 100 cc/h of NS -->  will give gentle IVf due to COVID-19 pneumonia  HTN:  --hold HCTZ and Cozaar due to worsening renal function -hydralazine prn  Adrenal adenoma-right: Incidental finding on CT scan -f/u with PCP  Acute renal failure superimposed on stage 3a chronic kidney disease ([place]): Baseline creatinine 1.04 on on 02/01/2017.  Her creatinine is 1.79, BUN 47, likely due to UTI and continuation of Cozaar and HCTZ -Hold Cozaar and HCTZ -IV fluid as above -Follow-up renal function by BMP   DVT ppx: SQ Lovenox Code Status: Full code Family Communication:Yes, patient's daughter by phone Disposition Plan:  Anticipate discharge back to previous home environment Consults called: None Admission status: Med-surg bed for obs    Status is: Observation  The patient remains OBS appropriate and will d/c before 2 midnights.  Dispo: The patient is from: Home              Anticipated d/c  is to: Home              Anticipated d/c date is: 1 day              Patient currently is not medically stable to d/c.          Date of Service 09/23/2019    Ivor Costa [facility]   If 7PM-7AM, please contact night-coverage [email or web address] 09/23/2019, 6:08 PM

## 2019-09-23 NOTE — Progress Notes (Signed)
CODE SEPSIS - PHARMACY COMMUNICATION  **Broad Spectrum Antibiotics should be administered within 1 hour of Sepsis diagnosis**  Time Code Sepsis Called/Page Received: [id number] per consult order by admitting MD  Antibiotics Ordered: ceftriaxone and azithromycin  Time of 1st antibiotic administration: 1323 - ceftriaxone  Additional action taken by pharmacy:   If necessary, Name of Provider/Nurse Contacted:     Rocky Morel ,PharmD Clinical Pharmacist  09/23/2019  2:22 PM

## 2019-09-23 NOTE — ED Provider Notes (Signed)
[facility] Emergency Department Provider Note  ____________________________________________   First MD Initiated Contact with Patient 09/23/19 0932     (approximate)  I have reviewed the triage vital signs and the nursing notes.   HISTORY  Chief Complaint Abdominal Pain (N/V/D)    HPI Victoria Wilkerson is a 74 y.o. female with history of hypertension, stroke, dementia, here with nausea, vomiting, and weakness.  The patient arrives with her daughter.  Per report, the patient has been increasingly weak and drowsy over the last several days.  She is normally able to walk and fairly interactive, but has since had progressively worsening decreased energy.  Over the last 24 hours, she has begun vomiting and has been unable to eat or drink.  She has complained of some mild abdominal pain.  She has been coughing as well.  She has had multiple sick contacts in the family that have had Covid.  She subsequent presents for further evaluation.  She was weak enough to barely be able to get out of bed today.  No other recent medication changes.  On my assessment, patient denies any complaints though history is limited due to dementia.        Past Medical History:  Diagnosis Date  . Hypertension   . TIA (transient ischemic attack)     Patient Active Problem List   Diagnosis Date Noted  . Pneumonia due to COVID-19 virus 09/23/2019  . Hypertension   . Sepsis ([place])   . UTI (urinary tract infection)   . Multifocal pneumonia   . Adrenal adenoma-right   . Acute renal failure superimposed on stage 3a chronic kidney disease ([place])   . Nausea vomiting and diarrhea     History reviewed. No pertinent surgical history.  Prior to Admission medications   Medication Sig Start Date End Date Taking? Authorizing Provider  hydrochlorothiazide (HYDRODIURIL) 12.5 MG tablet Take 12.5 mg by mouth daily.   Yes [provider]  losartan (COZAAR) 25 MG tablet Take 25 mg by mouth  daily.   Yes [provider]    Allergies Patient has no known allergies.  History reviewed. No pertinent family history.  Social History Social History   Tobacco Use  . Smoking status: Never Smoker  . Smokeless tobacco: Never Used  Substance Use Topics  . Alcohol use: Not on file  . Drug use: Not on file    Review of Systems  Review of Systems  Unable to perform ROS: Dementia  All other systems reviewed and are negative.    ____________________________________________  PHYSICAL EXAM:      VITAL SIGNS: ED Triage Vitals  Enc Vitals Group     BP 09/23/19 0918 132/68     Pulse Rate 09/23/19 0918 93     Resp 09/23/19 0918 18     Temp 09/23/19 0918 99.2 F (37.3 C)     Temp Source 09/23/19 0918 Oral     SpO2 09/23/19 0915 98 %     Weight 09/23/19 0919 160 lb (72.6 kg)     Height 09/23/19 0919 5\' 1"  (1.549 m)     Head Circumference --      Peak Flow --      Pain Score 09/23/19 0919 0     Pain Loc --      Pain Edu? --      Excl. in [place]? --      Physical Exam Vitals and nursing note reviewed.  Constitutional:      General:  She is not in acute distress.    Appearance: She is well-developed.  HENT:     Head: Normocephalic and atraumatic.  Eyes:     Conjunctiva/sclera: Conjunctivae normal.  Cardiovascular:     Rate and Rhythm: Normal rate and regular rhythm.     Heart sounds: Normal heart sounds.  Pulmonary:     Effort: Pulmonary effort is normal. No respiratory distress.     Breath sounds: No wheezing.  Abdominal:     General: There is no distension.     Tenderness: There is abdominal tenderness in the suprapubic area and left lower quadrant.  Musculoskeletal:     Cervical back: Neck supple.  Skin:    General: Skin is warm.     Capillary Refill: Capillary refill takes less than 2 seconds.     Findings: No rash.  Neurological:     Mental Status: She is alert. Mental status is at baseline. She is disoriented.     Motor: No abnormal muscle tone.         ____________________________________________   LABS (all labs ordered are listed, but only abnormal results are displayed)  Labs Reviewed  SARS CORONAVIRUS 2 BY RT PCR ([facility]) - Abnormal; Notable for the following components:      Result Value   SARS Coronavirus 2 POSITIVE (*)    All other components within normal limits  COMPREHENSIVE METABOLIC PANEL - Abnormal; Notable for the following components:   Glucose, Bld 107 (*)    BUN 47 (*)    Creatinine, Ser 1.79 (*)    Calcium 8.6 (*)    Total Protein 8.7 (*)    GFR calc non Af Amer 28 (*)    GFR calc Af Amer 32 (*)    All other components within normal limits  URINALYSIS, COMPLETE (UACMP) WITH MICROSCOPIC - Abnormal; Notable for the following components:   Color, Urine YELLOW (*)    APPearance CLOUDY (*)    Specific Gravity, Urine 1.042 (*)    Hgb urine dipstick SMALL (*)    Protein, ur 30 (*)    Leukocytes,Ua LARGE (*)    WBC, UA >50 (*)    All other components within normal limits  POC SARS CORONAVIRUS 2 AG - Abnormal; Notable for the following components:   SARS Coronavirus 2 Ag POSITIVE (*)    All other components within normal limits  CULTURE, BLOOD (ROUTINE X 2)  CULTURE, BLOOD (ROUTINE X 2)  URINE CULTURE  LIPASE, BLOOD  CBC  PROCALCITONIN  LACTIC ACID, PLASMA  LACTIC ACID, PLASMA  POC SARS CORONAVIRUS 2 AG -  ED    ____________________________________________  EKG: Normal sinus rhythm, ventricular rate 93.  PR 161, QRS 90, QTc 415.  No acute ST elevations or depressions.  No EKG evidence of acute ischemia or infarct. ________________________________________  RADIOLOGY All imaging, including plain films, CT scans, and ultrasounds, independently reviewed by me, and interpretations confirmed via formal radiology reads.  ED MD interpretation:   CT abdomen/pelvis: Multiple airspace opacities in the visualized lung, right adrenal adenoma Chest x-ray:  Clear  Official radiology report(s): CT ABDOMEN PELVIS W CONTRAST  Result Date: 09/23/2019 CLINICAL DATA:  Nausea, vomiting. EXAM: CT ABDOMEN AND PELVIS WITH CONTRAST TECHNIQUE: Multidetector CT imaging of the abdomen and pelvis was performed using the standard protocol following bolus administration of intravenous contrast. CONTRAST:  15mL OMNIPAQUE IOHEXOL 300 MG/ML  SOLN COMPARISON:  None. FINDINGS: Lower chest: Multifocal airspace opacities are noted in  the visualized lung bases concerning for multifocal pneumonia, potentially of viral etiology. Hepatobiliary: No focal liver abnormality is seen. Status post cholecystectomy. No biliary dilatation. Pancreas: Unremarkable. No pancreatic ductal dilatation or surrounding inflammatory changes. Spleen: Normal in size without focal abnormality. Adrenals/Urinary Tract: 1.6 cm right adrenal adenoma is noted. Left adrenal gland appears normal. No hydronephrosis or renal obstruction is noted. No renal or ureteral calculi are noted. Urinary bladder is unremarkable. Stomach/Bowel: Stomach is within normal limits. Appendix appears normal. No evidence of bowel wall thickening, distention, or inflammatory changes. Vascular/Lymphatic: No significant vascular findings are present. No enlarged abdominal or pelvic lymph nodes. Reproductive: Uterus and bilateral adnexa are unremarkable. Other: No abdominal wall hernia or abnormality. No abdominopelvic ascites. Musculoskeletal: No acute or significant osseous findings. IMPRESSION: 1. Multiple airspace opacities are noted in the visualized lung bases concerning for multifocal pneumonia, potentially of viral etiology. 2. 1.6 cm right adrenal adenoma. 3. No other abnormality seen in the abdomen or pelvis. Electronically Signed   By: Marijo Conception M.D.   On: 09/23/2019 10:47   DG Chest Portable 1 View  Result Date: 09/23/2019 CLINICAL DATA:  Fever and weakness. Patient c/o N/V/D and abdominal pain since yesterday.Hx of TIA,  htn, non smoker EXAM: PORTABLE CHEST 1 VIEW COMPARISON:  None. FINDINGS: The heart size and mediastinal contours are within normal limits. Both lungs are clear. No pleural effusion or pneumothorax. The visualized skeletal structures are grossly intact. IMPRESSION: No active disease. Electronically Signed   By: Lajean Manes M.D.   On: 09/23/2019 10:07    ____________________________________________  PROCEDURES   Procedure(s) performed (including Critical Care):  Procedures  ____________________________________________  INITIAL IMPRESSION / MDM / [place] / ED COURSE  As part of my medical decision making, I reviewed the following data within the [place] notes reviewed and incorporated, Old chart reviewed, Notes from prior ED visits, and [place] Controlled Substance Database       *Patti Kimpel was evaluated in Emergency Department on 09/23/2019 for the symptoms described in the history of present illness. She was evaluated in the context of the global COVID-19 pandemic, which necessitated consideration that the patient might be at risk for infection with the SARS-CoV-2 virus that causes COVID-19. Institutional protocols and algorithms that pertain to the evaluation of patients at risk for COVID-19 are in a state of rapid change based on information released by regulatory bodies including the CDC and federal and state organizations. These policies and algorithms were followed during the patient's care in the ED.  Some ED evaluations and interventions may be delayed as a result of limited staffing during the pandemic.*     Medical Decision Making: 74 year old female here with fever, nausea, vomiting, generalized weakness.  Lab work and imaging as above.  Patient has moderate leukocytosis but normal lactic acid without evidence of severe sepsis.  Her chest x-ray is clear, but CT scan does show multifocal pneumonia and urinalysis is consistent with UTI.   Differential includes UTI, pneumonia, but must also consider COVID-19 and this test is pending.  She is febrile, dehydrated clinically, and generally weak.  Will plan to admit.  Rocephin/azithromycin empirically while awaiting Covid testing.  ____________________________________________  FINAL CLINICAL IMPRESSION(S) / ED DIAGNOSES  Final diagnoses:  Encephalopathy  Acute cystitis without hematuria  Multifocal pneumonia     MEDICATIONS GIVEN DURING THIS VISIT:  Medications  azithromycin (ZITHROMAX) 500 mg in sodium chloride 0.9 % 250 mL IVPB (has no administration in time range)  acetaminophen (TYLENOL) tablet 650 mg (has no administration in time range)  ondansetron (ZOFRAN) injection 4 mg (has no administration in time range)  0.9 %  sodium chloride infusion ( Intravenous New Bag/Given 09/23/19 1323)  albuterol (VENTOLIN HFA) 108 (90 Base) MCG/ACT inhaler 2 puff (has no administration in time range)  dextromethorphan-guaiFENesin (MUCINEX DM) 30-600 MG per 12 hr tablet 1 tablet (has no administration in time range)  ascorbic acid (VITAMIN C) tablet 500 mg (has no administration in time range)  zinc sulfate capsule 220 mg (has no administration in time range)  iohexol (OMNIPAQUE) 300 MG/ML solution 75 mL (75 mLs Intravenous Contrast Given 09/23/19 1025)  cefTRIAXone (ROCEPHIN) 2 g in sodium chloride 0.9 % 100 mL IVPB (2 g Intravenous New Bag/Given 09/23/19 1323)     ED Discharge Orders    None       Note:  This document was prepared using Dragon voice recognition software and may include unintentional dictation errors.   Duffy Bruce, MD 09/23/19 1410

## 2019-09-23 NOTE — ED Triage Notes (Signed)
Pt to ED via [facility] from home. Per EMS pt c/o N/V/D and abdominal pain since yesterday. Pt denies CP or SOB.   Upon arrial pt in NAD. Pt denies abdominal pain currently. Pt with hx dementia, TIA, HTN. Pt tx for UTI 2wks ago and finished course of antibiotics. PT did not take home meds this morning.

## 2019-09-23 NOTE — Progress Notes (Signed)
Remdesivir - Pharmacy Brief Note   O:  ALT: 24   A/P:  Remdesivir 200 mg IVPB once followed by 100 mg IVPB daily x 4 days.     Rayna Sexton, PharmD, BCPS Clinical Pharmacist 09/23/2019 2:27 PM

## 2019-09-23 NOTE — Progress Notes (Signed)
Pharmacy Lovenox Dosing  74 y.o. female admitted with Abdominal Pain (N/V/D) . Patient ordered Lovenox 40 mg daily for VTE prophylaxis.   Filed Weights   09/23/19 0919  Weight: 72.6 kg (160 lb)    Body mass index is 30.23 kg/m.  Estimated Creatinine Clearance: 26.3 mL/min (A) (by C-G formula based on SCr of 1.79 mg/dL (H)).  Will adjust Lovenox dosing to 30 mg Q24 hours d/t CrCl<80ml/min    Pearla Dubonnet 09/23/2019 9:43 PM

## 2019-09-23 NOTE — ED Notes (Signed)
Pt transported to CT ?

## 2019-09-24 DIAGNOSIS — G9340 Encephalopathy, unspecified: Secondary | ICD-10-CM | POA: Diagnosis present

## 2019-09-24 DIAGNOSIS — J1282 Pneumonia due to coronavirus disease 2019: Secondary | ICD-10-CM

## 2019-09-24 DIAGNOSIS — G9349 Other encephalopathy: Secondary | ICD-10-CM | POA: Diagnosis not present

## 2019-09-24 DIAGNOSIS — E876 Hypokalemia: Secondary | ICD-10-CM | POA: Diagnosis present

## 2019-09-24 DIAGNOSIS — N179 Acute kidney failure, unspecified: Secondary | ICD-10-CM | POA: Diagnosis present

## 2019-09-24 DIAGNOSIS — Z79899 Other long term (current) drug therapy: Secondary | ICD-10-CM | POA: Diagnosis not present

## 2019-09-24 DIAGNOSIS — E860 Dehydration: Secondary | ICD-10-CM | POA: Diagnosis present

## 2019-09-24 DIAGNOSIS — N1831 Chronic kidney disease, stage 3a: Secondary | ICD-10-CM | POA: Diagnosis present

## 2019-09-24 DIAGNOSIS — A4189 Other specified sepsis: Secondary | ICD-10-CM | POA: Diagnosis present

## 2019-09-24 DIAGNOSIS — N390 Urinary tract infection, site not specified: Secondary | ICD-10-CM | POA: Diagnosis present

## 2019-09-24 DIAGNOSIS — I129 Hypertensive chronic kidney disease with stage 1 through stage 4 chronic kidney disease, or unspecified chronic kidney disease: Secondary | ICD-10-CM | POA: Diagnosis present

## 2019-09-24 DIAGNOSIS — F0390 Unspecified dementia without behavioral disturbance: Secondary | ICD-10-CM | POA: Diagnosis present

## 2019-09-24 DIAGNOSIS — Z8673 Personal history of transient ischemic attack (TIA), and cerebral infarction without residual deficits: Secondary | ICD-10-CM | POA: Diagnosis not present

## 2019-09-24 DIAGNOSIS — Z23 Encounter for immunization: Secondary | ICD-10-CM | POA: Diagnosis present

## 2019-09-24 DIAGNOSIS — U071 COVID-19: Secondary | ICD-10-CM | POA: Diagnosis present

## 2019-09-24 DIAGNOSIS — D3501 Benign neoplasm of right adrenal gland: Secondary | ICD-10-CM | POA: Diagnosis present

## 2019-09-24 LAB — CBC WITH DIFFERENTIAL/PLATELET
Abs Immature Granulocytes: 0.04 10*3/uL (ref 0.00–0.07)
Basophils Absolute: 0 10*3/uL (ref 0.0–0.1)
Basophils Relative: 0 %
Eosinophils Absolute: 0 10*3/uL (ref 0.0–0.5)
Eosinophils Relative: 0 %
HCT: 41.1 % (ref 36.0–46.0)
Hemoglobin: 13.3 g/dL (ref 12.0–15.0)
Immature Granulocytes: 1 %
Lymphocytes Relative: 25 %
Lymphs Abs: 1.1 10*3/uL (ref 0.7–4.0)
MCH: 28.9 pg (ref 26.0–34.0)
MCHC: 32.4 g/dL (ref 30.0–36.0)
MCV: 89.2 fL (ref 80.0–100.0)
Monocytes Absolute: 0.3 10*3/uL (ref 0.1–1.0)
Monocytes Relative: 7 %
Neutro Abs: 3 10*3/uL (ref 1.7–7.7)
Neutrophils Relative %: 67 %
Platelets: 232 10*3/uL (ref 150–400)
RBC: 4.61 MIL/uL (ref 3.87–5.11)
RDW: 13.2 % (ref 11.5–15.5)
Smear Review: NORMAL
WBC: 4.5 10*3/uL (ref 4.0–10.5)
nRBC: 0 % (ref 0.0–0.2)

## 2019-09-24 LAB — URINE CULTURE

## 2019-09-24 LAB — COMPREHENSIVE METABOLIC PANEL
ALT: 21 U/L (ref 0–44)
AST: 41 U/L (ref 15–41)
Albumin: 3.5 g/dL (ref 3.5–5.0)
Alkaline Phosphatase: 60 U/L (ref 38–126)
Anion gap: 11 (ref 5–15)
BUN: 42 mg/dL — ABNORMAL HIGH (ref 8–23)
CO2: 21 mmol/L — ABNORMAL LOW (ref 22–32)
Calcium: 8.4 mg/dL — ABNORMAL LOW (ref 8.9–10.3)
Chloride: 107 mmol/L (ref 98–111)
Creatinine, Ser: 1.39 mg/dL — ABNORMAL HIGH (ref 0.44–1.00)
GFR calc Af Amer: 44 mL/min — ABNORMAL LOW (ref >60)
GFR calc non Af Amer: 38 mL/min — ABNORMAL LOW (ref >60)
Glucose, Bld: 89 mg/dL (ref 70–99)
Potassium: 3.5 mmol/L (ref 3.5–5.1)
Sodium: 139 mmol/L (ref 135–145)
Total Bilirubin: 0.6 mg/dL (ref 0.3–1.2)
Total Protein: 8.1 g/dL (ref 6.5–8.1)

## 2019-09-24 LAB — MAGNESIUM: Magnesium: 2.1 mg/dL (ref 1.7–2.4)

## 2019-09-24 LAB — C-REACTIVE PROTEIN
CRP: 10.2 mg/dL — ABNORMAL HIGH (ref <1.0)
CRP: 9.1 mg/dL — ABNORMAL HIGH (ref <1.0)

## 2019-09-24 LAB — FERRITIN: Ferritin: 360 ng/mL — ABNORMAL HIGH (ref 11–307)

## 2019-09-24 LAB — LACTIC ACID, PLASMA: Lactic Acid, Venous: 0.9 mmol/L (ref 0.5–1.9)

## 2019-09-24 LAB — HEPATITIS B SURFACE ANTIGEN: Hepatitis B Surface Ag: NONREACTIVE

## 2019-09-24 LAB — FIBRIN DERIVATIVES D-DIMER (ARMC ONLY): Fibrin derivatives D-dimer (ARMC): 2529.19 ng/mL (FEU) — ABNORMAL HIGH (ref 0.00–499.00)

## 2019-09-24 LAB — TROPONIN I (HIGH SENSITIVITY): Troponin I (High Sensitivity): 17 ng/L (ref <18)

## 2019-09-24 MED ORDER — ENOXAPARIN SODIUM 40 MG/0.4ML ~~LOC~~ SOLN
40.0000 mg | SUBCUTANEOUS | Status: DC
  Administered 2019-09-24 – 2019-09-26 (×3): 40 mg via SUBCUTANEOUS
  Filled 2019-09-24 (×3): qty 0.4

## 2019-09-24 MED ORDER — PNEUMOCOCCAL VAC POLYVALENT 25 MCG/0.5ML IJ INJ
0.5000 mL | INJECTION | INTRAMUSCULAR | Status: AC
  Administered 2019-09-26: 0.5 mL via INTRAMUSCULAR
  Filled 2019-09-24: qty 0.5

## 2019-09-24 MED ORDER — SODIUM CHLORIDE 0.9 % IV SOLN
500.0000 mg | INTRAVENOUS | Status: AC
  Administered 2019-09-24 – 2019-09-25 (×2): 500 mg via INTRAVENOUS
  Filled 2019-09-24 (×2): qty 500

## 2019-09-24 NOTE — Evaluation (Signed)
Occupational Therapy Evaluation Patient Details Name: Victoria Wilkerson MRN: [id number] DOB: Aug 07, 1945 Today's Date: 09/24/2019    History of Present Illness Per MD note: Victoria Wilkerson is a 74 y.o. female with medical history significant of hypertension, TIA, CKD-3, mild dementia, who presents with cough, generalized weakness, fever, nausea, vomiting and abdominal pain. W/u in ED revealed + COVID and PNA on CT.   Clinical Impression   Pt was seen for OT evaluation this date. Prior to hospital admission, pt was Indep with self care BADLs, had family assist for IADLs, and her daughters traded off supervision responsibilities of patient for safety. Pt lives in mobile home with her daughter Victoria Wilkerson who primarily cares for pt. Her daughter works second shift and has patient stay with her other daughter, Victoria Wilkerson at that time. Currently pt demonstrates impairments as described below (See OT problem list) which functionally limit her ability to perform ADL/self-care tasks. Pt currently requires Supv with fxl mobility (RW given to patient, but does not appear particularly relied on/required for balance) and standing ADLs.  Pt would benefit from skilled OT to address noted impairments and functional limitations (see below for any additional details) in order to maximize safety and independence while minimizing falls risk and caregiver burden. Upon hospital discharge, recommend HHOT to maximize pt safety and return to functional independence during meaningful occupations of daily life.     Follow Up Recommendations  Home health OT;Supervision/Assistance - 24 hour    Equipment Recommendations  Tub/shower seat;Other (comment)(grab bar in shower)    Recommendations for Other Services       Precautions / Restrictions Precautions Precautions: Fall Restrictions Weight Bearing Restrictions: No      Mobility Bed Mobility Overal bed mobility: Modified Independent             General bed mobility  comments: HOB elevated, use of bed rail  Transfers Overall transfer level: Needs assistance Equipment used: Rolling walker (2 wheeled) Transfers: Sit to/from [facility] Sit to Stand: Supervision;Min guard Stand pivot transfers: Supervision;Min guard       General transfer comment: RW given to pt, but pt does not seem to rely on UE support    Balance Overall balance assessment: Mild deficits observed, not formally tested                                         ADL either performed or assessed with clinical judgement   ADL Overall ADL's : Needs assistance/impaired Eating/Feeding: Independent   Grooming: Wash/dry hands;Wash/dry face;Oral care;Set up;Cueing for sequencing           Upper Body Dressing : Set up;Sitting   Lower Body Dressing: Set up;Min guard;Sit to/from stand   Toilet Transfer: Psychologist, sport and exercise Details (indicate cue type and reason): RW given to patient, but not particularly used for transfer         Functional mobility during ADLs: Supervision/safety;Min guard;Rolling walker(4-5 side steps to chair, demos good balance, little reliance on RW)       Vision   Additional Comments: dtr reports that mother does not wear glasses, but probably needs to. Pt is unable to read clock in the room, but does adequately track therapist for fxl tasks asssessed.     Perception     Praxis      Pertinent Vitals/Pain Pain Assessment: Faces Faces Pain Scale: No hurt     Hand Dominance  Extremity/Trunk Assessment Upper Extremity Assessment Upper Extremity Assessment: Overall WFL for tasks assessed(grossly 4-/5 shld. And 4/5 elbow/grasp)   Lower Extremity Assessment Lower Extremity Assessment: Defer to PT evaluation;Overall WFL for tasks assessed       Communication Communication Communication: No difficulties   Cognition Arousal/Alertness: Awake/alert Behavior During Therapy: WFL for tasks  assessed/performed Overall Cognitive Status: History of cognitive impairments - at baseline                                 General Comments: Pt oriented to self and knows she is in hospital, but not sure which hospital. Not oriented to temporal concepts. Appropriate converstaionally, some tearfullness over fear of missing church services while in the hospital.   General Comments  Pt with G static and dynamic sitting balance, G static standing. RW given to pt and pt does somewhat use, but does not appear to require UE support to sustain static stand or for short fxl mobility distance from bed to chair    Exercises     Shoulder Instructions      Home Living Family/patient expects to be discharged to:: Private residence Living Arrangements: Children Available Help at Discharge: Family(2 daughters aletnate assisting pt) Type of Home: Mobile home Home Access: Ramped entrance     Home Layout: One level     Bathroom Shower/Tub: Teacher, early years/pre: Standard     Home Equipment: None   Additional Comments: Pt is poor historian. OT contacts pt's daughter via phone who indicates that pt was able to perform fxl mobility with no AD with only one potential, unwitnessed fall in last year (pt reported this to another family member but unclear validity)      Prior Functioning/Environment Level of Independence: Needs assistance  Gait / Transfers Assistance Needed: Indep ADL's / Homemaking Assistance Needed: Pt able to perform her BADLs, showers standing with no DME or grab bar. Pt's daughter that she lives with, Victoria Wilkerson, manages meds, gets groceries and cooks. Pt's other daughter, Victoria Wilkerson is with patient when Victoria Wilkerson has to go to work (2nd shift).            OT Problem List: Decreased strength;Decreased activity tolerance;Decreased cognition;Decreased safety awareness;Decreased knowledge of use of DME or AE      OT Treatment/Interventions: Self-care/ADL  training;Therapeutic exercise;Energy conservation;DME and/or AE instruction;Therapeutic activities;Patient/family education;Balance training    OT Goals(Current goals can be found in the care plan section) Acute Rehab OT Goals Patient Stated Goal: to go home and get to go to church OT Goal Formulation: With patient Time For Goal Achievement: 10/08/19 Potential to Achieve Goals: Good  OT Frequency: Min 1X/week   Barriers to D/C:            Co-evaluation              AM-PAC OT "6 Clicks" Daily Activity     Outcome Measure Help from another person eating meals?: None Help from another person taking care of personal grooming?: A Little Help from another person toileting, which includes using toliet, bedpan, or urinal?: A Little Help from another person bathing (including washing, rinsing, drying)?: A Little Help from another person to put on and taking off regular upper body clothing?: A Little Help from another person to put on and taking off regular lower body clothing?: A Little 6 Click Score: 19   End of Session Equipment Utilized During Treatment: Gait belt;Rolling walker Nurse  Communication: Mobility status  Activity Tolerance: Patient tolerated treatment well Patient left: in chair;with call bell/phone within reach;with chair alarm set;Other (comment)(telesitter)  OT Visit Diagnosis: Muscle weakness (generalized) (M62.81);Other symptoms and signs involving cognitive function                Time: [id number] OT Time Calculation (min): 38 min Charges:  OT General Charges $OT Visit: 1 Visit OT Evaluation $OT Eval Moderate Complexity: 1 Mod OT Treatments $Self Care/Home Management : 8-22 mins $Therapeutic Activity: 8-22 mins  Gerrianne Scale, MS, OTR/L ascom [phone] 09/24/19, 2:22 PM

## 2019-09-24 NOTE — Evaluation (Signed)
Physical Therapy Evaluation Patient Details Name: Victoria Wilkerson MRN: [id number] DOB: April 02, 1946 Today's Date: 09/24/2019   History of Present Illness  Pt is a 74 y.o. female presenting to hospital with N/V, weakness, cough, and abdominal pain.  Admitted with PNA d/t COVID-19 virus, UTI, and sepsis.  PMH includes htn, stroke, and dementia.  Clinical Impression  Prior to hospital admission, pt reports being independent with ambulation and lives with her daughter.  Currently pt is SBA to CGA with transfers and CGA with ambulation 120 feet in room with RW (pt initially unsteady attempting to walk without UE support requiring min to mod assist for balance but improved safety and balance and gait mechanics noted with RW use).  O2 sats 95% or greater on room air and HR 80 to 100 bpm (briefly 112 bpm) during sessions activities.  Pt would benefit from skilled PT to address noted impairments and functional limitations (see below for any additional details).  Upon hospital discharge, pt would benefit from HHPT and supervision for mobility.    Follow Up Recommendations Home health PT;Supervision for mobility/OOB    Equipment Recommendations  Rolling walker with 5" wheels;3in1 (PT)    Recommendations for Other Services OT consult     Precautions / Restrictions Precautions Precautions: Fall Restrictions Weight Bearing Restrictions: No      Mobility  Bed Mobility             General bed mobility comments: Deferred (pt in recliner beginning and end of session)  Transfers Overall transfer level: Needs assistance Equipment used: Rolling walker (2 wheeled) Transfers: Sit to/from Stand Sit to Stand: Min guard;Supervision Stand pivot transfers: Supervision;Min guard       General transfer comment: x1 trial; fairly strong stand  Ambulation/Gait Ambulation/Gait assistance: Min guard Gait Distance (Feet): 120 Feet(in room) Assistive device: Rolling walker (2 wheeled)   Gait velocity:  mildly decreased   General Gait Details: pt unsteady attempting to walk without UE support (requiring min to mod assist for balance) so trialed RW and pt appearing steady with RW  Stairs            Wheelchair Mobility    Modified Rankin (Stroke Patients Only)       Balance Overall balance assessment: Needs assistance Sitting-balance support: No upper extremity supported;Feet supported Sitting balance-Leahy Scale: Good Sitting balance - Comments: steady sitting reaching within BOS   Standing balance support: No upper extremity supported;During functional activity Standing balance-Leahy Scale: Poor Standing balance comment: pt unsteady attempting to walk requiring assist for balance                             Pertinent Vitals/Pain Pain Assessment: No/denies pain Faces Pain Scale: No hurt Pain Intervention(s): Limited activity within patient's tolerance;Monitored during session;Repositioned    Home Living Family/patient expects to be discharged to:: Private residence Living Arrangements: Children(pt's daughter) Available Help at Discharge: Family(per OT--2 daughters alternate assisting pt) Type of Home: Mobile home Home Access: Ramped entrance     Home Layout: One level Home Equipment: None Additional Comments: Per OT "Pt is poor historian. OT contacts pt's daughter via phone who indicates that pt was able to perform fxl mobility with no AD with only one potential, unwitnessed fall in last year (pt reported this to another family member but unclear validity)".  Except for living with daughter and not using any AD to walk, above information per OT eval (d/t pt not answering any other questions when  asked).    Prior Function Level of Independence: Needs assistance   Gait / Transfers Assistance Needed: Independent  ADL's / Homemaking Assistance Needed: Per OT eval "Pt able to perform her BADLs, showers standing with no DME or grab bar. Pt's daughter that she  lives with, Victoria Wilkerson, manages meds, gets groceries and cooks. Pt's other daughter, Victoria Wilkerson is with patient when Victoria Wilkerson has to go to work (2nd shift)."        Hand Dominance        Extremity/Trunk Assessment   Upper Extremity Assessment Upper Extremity Assessment: Defer to OT evaluation    Lower Extremity Assessment Lower Extremity Assessment: Generalized weakness    Cervical / Trunk Assessment Cervical / Trunk Assessment: Normal  Communication   Communication: No difficulties  Cognition Arousal/Alertness: Awake/alert Behavior During Therapy: WFL for tasks assessed/performed Overall Cognitive Status: No family/caregiver present to determine baseline cognitive functioning                                 General Comments: Pt reports being in hospital.  General confusion noted.      General Comments    Exercises  Gait training with RW   Assessment/Plan    PT Assessment Patient needs continued PT services  PT Problem List Decreased strength;Decreased balance;Decreased mobility;Decreased knowledge of use of DME;Decreased knowledge of precautions       PT Treatment Interventions DME instruction;Gait training;Stair training;Functional mobility training;Therapeutic activities;Therapeutic exercise;Balance training;Patient/family education    PT Goals (Current goals can be found in the Care Plan section)  Acute Rehab PT Goals Patient Stated Goal: to go home and go to church PT Goal Formulation: With patient Time For Goal Achievement: 10/08/19 Potential to Achieve Goals: Good    Frequency Min 2X/week   Barriers to discharge        Co-evaluation               AM-PAC PT "6 Clicks" Mobility  Outcome Measure Help needed turning from your back to your side while in a flat bed without using bedrails?: None Help needed moving from lying on your back to sitting on the side of a flat bed without using bedrails?: None Help needed moving to and from a bed to a  chair (including a wheelchair)?: A Little Help needed standing up from a chair using your arms (e.g., wheelchair or bedside chair)?: A Little Help needed to walk in hospital room?: A Little Help needed climbing 3-5 steps with a railing? : A Little 6 Click Score: 20    End of Session Equipment Utilized During Treatment: Gait belt Activity Tolerance: Patient tolerated treatment well Patient left: in chair;with call bell/phone within reach;with chair alarm set;with nursing/sitter in room Nurse Communication: Mobility status;Precautions PT Visit Diagnosis: Unsteadiness on feet (R26.81);Muscle weakness (generalized) (M62.81);Other abnormalities of gait and mobility (R26.89)    Time: [id number] PT Time Calculation (min) (ACUTE ONLY): 40 min   Charges:   PT Evaluation $PT Eval Low Complexity: 1 Low PT Treatments $Therapeutic Exercise: 8-22 mins       Leitha Bleak, PT 09/24/19, 5:19 PM

## 2019-09-24 NOTE — Progress Notes (Signed)
PROGRESS NOTE    Victoria Wilkerson  [id number] DOB: 03-27-1946 DOA: 09/23/2019 PCP: [facility]    Brief Narrative:  Victoria Wilkerson is a 74 y.o. female with medical history significant of hypertension, TIA, CKD-3, mild dementia, who presents with cough, generalized weakness, fever, nausea, vomiting and abdominal pain.  Per her daughter (I called her daughter by phone), patient has been increasingly weak in the past several days.  Patient has cough, fever and chills. Patient denies chest pain.  No respiratory distress.  She also has nausea, vomiting and some mild abdominal pain.  No diarrhea.  Not sure if patient has symptoms of UTI.  She moves all extremities.  Patient was treated for UTI approximately 4 weeks ago per her daughter.  ED Course: pt was found to have WBC 4.1, lipase 48, positive Covid Ag test, positive urinalysis (cloudy appearance, large amount of leukocyte, negative bacteria, WBC >50), worsening renal function, temperature 100.7, blood pressure 131/70, heart rate 90s, oxygen saturation 95% on room air.  Chest x-ray negative.  CT abdomen/pelvis showed multifocal pneumonia and 1.6 cm right adrenal adenoma.  Patient is placed on MedSurg bed for observation   Consultants:       Antimicrobials:   Ceftriaxone, azithromycin, remdesivir   Subjective: Pt confused a bit. Spoke to daughter this is not knew as she has dementia, some days worse than other days.  Patient has no complaints     Objective: Vitals:   09/24/19 0530 09/24/19 0630 09/24/19 0856 09/24/19 0927  BP: 119/72 132/73  139/66  Pulse: 81   77  Resp: 19   17  Temp:   99.1 F (37.3 C) 98.9 F (37.2 C)  TempSrc:   Oral Oral  SpO2: 100%   97%  Weight:      Height:       No intake or output data in the 24 hours ending 09/24/19 1337 Filed Weights   09/23/19 0919  Weight: 72.6 kg    Examination:  General exam: Appears calm and comfortable  Respiratory system: mild rhonchi  b/l , no wheezing Cardiovascular system: S1 & S2 heard, RRR. No JVD, murmurs, rubs, gallops or clicks.  Gastrointestinal system: Abdomen is nondistended, soft and nontender.. Normal bowel sounds heard. Central nervous system: Alert and oriented x2. Not to date.grossly intact extremities: No edema Skin: Warm dry Psychiatry:  Mood & affect appropriate.     Data Reviewed: I have personally reviewed following labs and imaging studies  CBC: Recent Labs  Lab 09/23/19 0921 09/24/19 0500  WBC 4.1 4.5  NEUTROABS  --  3.0  HGB 13.5 13.3  HCT 42.0 41.1  MCV 88.8 89.2  PLT 220 [id number]   Basic Metabolic Panel: Recent Labs  Lab 09/23/19 0921 09/23/19 2300 09/24/19 0500  NA 137  --  139  K 3.5  --  3.5  CL 102  --  107  CO2 25  --  21*  GLUCOSE 107*  --  89  BUN 47*  --  42*  CREATININE 1.79*  --  1.39*  CALCIUM 8.6*  --  8.4*  MG  --  2.1  --    GFR: Estimated Creatinine Clearance: 33.8 mL/min (A) (by C-G formula based on SCr of 1.39 mg/dL (H)). Liver Function Tests: Recent Labs  Lab 09/23/19 0921 09/24/19 0500  AST 40 41  ALT 24 21  ALKPHOS 66 60  BILITOT 0.8 0.6  PROT 8.7* 8.1  ALBUMIN 3.9 3.5   Recent Labs  Lab  09/23/19 0921  LIPASE 48   No results for input(s): AMMONIA in the last 168 hours. Coagulation Profile: No results for input(s): INR, PROTIME in the last 168 hours. Cardiac Enzymes: No results for input(s): CKTOTAL, CKMB, CKMBINDEX, TROPONINI in the last 168 hours. BNP (last 3 results) No results for input(s): PROBNP in the last 8760 hours. HbA1C: No results for input(s): HGBA1C in the last 72 hours. CBG: No results for input(s): GLUCAP in the last 168 hours. Lipid Profile: Recent Labs    09/23/19 2028  TRIG 93   Thyroid Function Tests: No results for input(s): TSH, T4TOTAL, FREET4, T3FREE, THYROIDAB in the last 72 hours. Anemia Panel: Recent Labs    09/23/19 2028 09/24/19 0500  FERRITIN 417* 360*   Sepsis Labs: Recent Labs  Lab  09/23/19 0931 09/23/19 1318 09/23/19 2028 09/23/19 2300  PROCALCITON <0.10  --   --   --   LATICACIDVEN  --  0.9 2.0* 0.9    Recent Results (from the past 240 hour(s))  Urine Culture     Status: Abnormal   Collection Time: 09/23/19 11:42 AM   Specimen: Urine, Random  Result Value Ref Range Status   Specimen Description   Final    URINE, RANDOM Performed at [facility], [address], [place], [place] [address]    Special Requests   Final    NONE Performed at [facility], [address] [place], [place] [address]    Culture MULTIPLE SPECIES PRESENT, SUGGEST RECOLLECTION (A)  Final   Report Status 09/24/2019 FINAL  Final  SARS Coronavirus 2 by RT PCR (hospital order, performed in [place] hospital lab) Nasopharyngeal Nasopharyngeal Swab     Status: Abnormal   Collection Time: 09/23/19 12:42 PM   Specimen: Nasopharyngeal Swab  Result Value Ref Range Status   SARS Coronavirus 2 POSITIVE (A) NEGATIVE Final    Comment: RESULT CALLED TO, READ BACK BY AND VERIFIED WITH: DEE MCCLAIN RN AT [id number] ON 09/23/19 SNG (NOTE) SARS-CoV-2 target nucleic acids are DETECTED SARS-CoV-2 RNA is generally detectable in upper respiratory specimens  during the acute phase of infection.  Positive results are indicative  of the presence of the identified virus, but do not rule out bacterial infection or co-infection with other pathogens not detected by the test.  Clinical correlation with patient history and  other diagnostic information is necessary to determine patient infection status.  The expected result is negative. Fact Sheet for Patients:   StrictlyIdeas.no  Fact Sheet for Healthcare Providers:   BankingDealers.co.za   This test is not yet approved or cleared by the [place] FDA and  has been authorized for detection and/or diagnosis of SARS-CoV-2 by FDA under an Emergency Use Authorization (EUA).  This EUA  will remain in effect (meaning this test ca n be used) for the duration of  the COVID-19 declaration under Section 564(b)(1) of the Act, 21 U.S.C. section 360-bbb-3(b)(1), unless the authorization is terminated or revoked sooner. Performed at [facility], [address] [place], [place] [address]   Blood culture (routine x 2)     Status: None (Preliminary result)   Collection Time: 09/23/19  1:17 PM   Specimen: BLOOD  Result Value Ref Range Status   Specimen Description BLOOD RIGHT [facility]  Final   Special Requests   Final    BOTTLES DRAWN AEROBIC AND ANAEROBIC Blood Culture results may not be optimal due to an inadequate volume of blood received in culture bottles   Culture  Final    NO GROWTH < 24 HOURS Performed at [facility], [address] [place], [place] [address]    Report Status PENDING  Incomplete  Blood culture (routine x 2)     Status: None (Preliminary result)   Collection Time: 09/23/19  1:17 PM   Specimen: BLOOD  Result Value Ref Range Status   Specimen Description BLOOD RIGHT FA  Final   Special Requests   Final    BOTTLES DRAWN AEROBIC AND ANAEROBIC Blood Culture results may not be optimal due to an inadequate volume of blood received in culture bottles   Culture   Final    NO GROWTH < 24 HOURS Performed at [facility], [address] [place], [place] [address]    Report Status PENDING  Incomplete         Radiology Studies: CT ABDOMEN PELVIS W CONTRAST  Result Date: 09/23/2019 CLINICAL DATA:  Nausea, vomiting. EXAM: CT ABDOMEN AND PELVIS WITH CONTRAST TECHNIQUE: Multidetector CT imaging of the abdomen and pelvis was performed using the standard protocol following bolus administration of intravenous contrast. CONTRAST:  10mL OMNIPAQUE IOHEXOL 300 MG/ML  SOLN COMPARISON:  None. FINDINGS: Lower chest: Multifocal airspace opacities are noted in the visualized lung bases concerning for multifocal pneumonia, potentially of viral  etiology. Hepatobiliary: No focal liver abnormality is seen. Status post cholecystectomy. No biliary dilatation. Pancreas: Unremarkable. No pancreatic ductal dilatation or surrounding inflammatory changes. Spleen: Normal in size without focal abnormality. Adrenals/Urinary Tract: 1.6 cm right adrenal adenoma is noted. Left adrenal gland appears normal. No hydronephrosis or renal obstruction is noted. No renal or ureteral calculi are noted. Urinary bladder is unremarkable. Stomach/Bowel: Stomach is within normal limits. Appendix appears normal. No evidence of bowel wall thickening, distention, or inflammatory changes. Vascular/Lymphatic: No significant vascular findings are present. No enlarged abdominal or pelvic lymph nodes. Reproductive: Uterus and bilateral adnexa are unremarkable. Other: No abdominal wall hernia or abnormality. No abdominopelvic ascites. Musculoskeletal: No acute or significant osseous findings. IMPRESSION: 1. Multiple airspace opacities are noted in the visualized lung bases concerning for multifocal pneumonia, potentially of viral etiology. 2. 1.6 cm right adrenal adenoma. 3. No other abnormality seen in the abdomen or pelvis. Electronically Signed   By: Marijo Conception M.D.   On: 09/23/2019 10:47   DG Chest Portable 1 View  Result Date: 09/23/2019 CLINICAL DATA:  Fever and weakness. Patient c/o N/V/D and abdominal pain since yesterday.Hx of TIA, htn, non smoker EXAM: PORTABLE CHEST 1 VIEW COMPARISON:  None. FINDINGS: The heart size and mediastinal contours are within normal limits. Both lungs are clear. No pleural effusion or pneumothorax. The visualized skeletal structures are grossly intact. IMPRESSION: No active disease. Electronically Signed   By: Lajean Manes M.D.   On: 09/23/2019 10:07        Scheduled Meds: . vitamin C  500 mg Oral Daily  . enoxaparin (LOVENOX) injection  30 mg Subcutaneous Q24H  . [START ON 09/25/2019] pneumococcal 23 valent vaccine  0.5 mL Intramuscular  Tomorrow-1000  . zinc sulfate  220 mg Oral Daily   Continuous Infusions: . sodium chloride 100 mL/hr at 09/24/19 0219  . cefTRIAXone (ROCEPHIN)  IV    . remdesivir 100 mg in NS 100 mL 100 mg (09/24/19 1027)    Assessment & Plan:   Principal Problem:   Pneumonia due to COVID-19 virus Active Problems:   Hypertension   Sepsis ([place])   UTI (urinary tract infection)   Adrenal adenoma-right   Acute renal  failure superimposed on stage 3a chronic kidney disease ([place])   Pneumonia due to COVID-19 virus: CT scan showed multifocal infiltration. . Continue Remdesivir, ceftriaxone, azithromycin -vitamin C, zinc.  -prn albuterol inhaler -PRN Mucinex for cough -f/u Blood culture, pending.  Urine culture multiple species needs recollection -Gentle IV fluid -D-dimer, BNP,Trop, LFT, CRP, LDH, Procalcitonin, Ferritin, fibinogen, TG, Hep B SAg, HIV ab -Daily CRP, Ferritin, D-dimer,  maintain euvolemia to a net negative fluid status -IF patient deteriorates, will consult PCCM and ID  UTI (urinary tract infection): -rocephin -f/u urine culture needs recollection  Sepsis due to  and pneumonia secondary to COVID-19 infection: Patient meets critical for sepsis with fever, tachycardia. elevated lactic acid  Currently hemodynamically stable CRP decreasing Thick acid improved with IV fluids  Procalcitonin and trend lactic acid levels per sepsis protocol. Decrease IV fluids to 50 cc/h   HTN:  --hold HCTZ and Cozaar due to worsening renal function -hydralazine prn  Adrenal adenoma-right: Incidental finding on CT scan -f/u with PCP  Acute renal failure superimposed on stage 3a chronic kidney disease ([place]): Baseline creatinine 1.04 on on 02/01/2017.   Improving with IV hydration, will decrease IV fluid rate to 50 mL/h   likely due to prerenal/sepsis and continuation of Cozaar and HCTZ -Hold Cozaar and HCTZ -IV fluid as above -Follow-up renal function by BMP  Dementia- at baseline when I  spoke to her daughter. Pt has good and bad days. At times not oriented at home.    DVT ppx: SQ Lovenox Code Status: Full code Family Communication: Spoke to daughter-via phone Disposition Plan:  Anticipate discharge back to previous home environment Barrier: Patient medically not stable to be discharged home, requiring IV fluids and IV antibiotics.  Possible DC in 1 to 2 days     LOS: 0 days   Time spent: 45 minutes with more than 50% on [place], MD [facility] Pager [phone]  If 7PM-7AM, please contact night-coverage [email or web address] Password [facility] 09/24/2019, 1:37 PM

## 2019-09-25 LAB — CBC WITH DIFFERENTIAL/PLATELET
Abs Immature Granulocytes: 0.03 10*3/uL (ref 0.00–0.07)
Basophils Absolute: 0 10*3/uL (ref 0.0–0.1)
Basophils Relative: 1 %
Eosinophils Absolute: 0 10*3/uL (ref 0.0–0.5)
Eosinophils Relative: 1 %
HCT: 39.3 % (ref 36.0–46.0)
Hemoglobin: 12.7 g/dL (ref 12.0–15.0)
Immature Granulocytes: 1 %
Lymphocytes Relative: 44 %
Lymphs Abs: 1.5 10*3/uL (ref 0.7–4.0)
MCH: 28.7 pg (ref 26.0–34.0)
MCHC: 32.3 g/dL (ref 30.0–36.0)
MCV: 88.7 fL (ref 80.0–100.0)
Monocytes Absolute: 0.3 10*3/uL (ref 0.1–1.0)
Monocytes Relative: 9 %
Neutro Abs: 1.5 10*3/uL — ABNORMAL LOW (ref 1.7–7.7)
Neutrophils Relative %: 44 %
Platelets: 229 10*3/uL (ref 150–400)
RBC: 4.43 MIL/uL (ref 3.87–5.11)
RDW: 13.2 % (ref 11.5–15.5)
WBC: 3.4 10*3/uL — ABNORMAL LOW (ref 4.0–10.5)
nRBC: 0 % (ref 0.0–0.2)

## 2019-09-25 LAB — COMPREHENSIVE METABOLIC PANEL
ALT: 18 U/L (ref 0–44)
AST: 38 U/L (ref 15–41)
Albumin: 3.3 g/dL — ABNORMAL LOW (ref 3.5–5.0)
Alkaline Phosphatase: 57 U/L (ref 38–126)
Anion gap: 6 (ref 5–15)
BUN: 34 mg/dL — ABNORMAL HIGH (ref 8–23)
CO2: 23 mmol/L (ref 22–32)
Calcium: 8.6 mg/dL — ABNORMAL LOW (ref 8.9–10.3)
Chloride: 113 mmol/L — ABNORMAL HIGH (ref 98–111)
Creatinine, Ser: 0.96 mg/dL (ref 0.44–1.00)
GFR calc Af Amer: >60 mL/min (ref >60)
GFR calc non Af Amer: 60 mL/min — ABNORMAL LOW (ref >60)
Glucose, Bld: 77 mg/dL (ref 70–99)
Potassium: 3.4 mmol/L — ABNORMAL LOW (ref 3.5–5.1)
Sodium: 142 mmol/L (ref 135–145)
Total Bilirubin: 0.4 mg/dL (ref 0.3–1.2)
Total Protein: 7.8 g/dL (ref 6.5–8.1)

## 2019-09-25 LAB — FIBRIN DERIVATIVES D-DIMER (ARMC ONLY): Fibrin derivatives D-dimer (ARMC): 1805.63 ng/mL (FEU) — ABNORMAL HIGH (ref 0.00–499.00)

## 2019-09-25 LAB — FERRITIN: Ferritin: 372 ng/mL — ABNORMAL HIGH (ref 11–307)

## 2019-09-25 LAB — C-REACTIVE PROTEIN: CRP: 5.6 mg/dL — ABNORMAL HIGH (ref <1.0)

## 2019-09-25 MED ORDER — ENSURE ENLIVE PO LIQD
237.0000 mL | Freq: Two times a day (BID) | ORAL | Status: DC
  Administered 2019-09-25 – 2019-09-27 (×4): 237 mL via ORAL

## 2019-09-25 MED ORDER — POTASSIUM CHLORIDE CRYS ER 20 MEQ PO TBCR
40.0000 meq | EXTENDED_RELEASE_TABLET | Freq: Once | ORAL | Status: AC
  Administered 2019-09-25: 40 meq via ORAL
  Filled 2019-09-25: qty 2

## 2019-09-25 MED ORDER — ADULT MULTIVITAMIN W/MINERALS CH
1.0000 | ORAL_TABLET | Freq: Every day | ORAL | Status: DC
  Administered 2019-09-26 – 2019-09-27 (×2): 1 via ORAL
  Filled 2019-09-25 (×2): qty 1

## 2019-09-25 MED ORDER — AMLODIPINE BESYLATE 5 MG PO TABS
5.0000 mg | ORAL_TABLET | Freq: Every day | ORAL | Status: DC
  Administered 2019-09-26: 08:00:00 5 mg via ORAL
  Filled 2019-09-25: qty 1

## 2019-09-25 NOTE — TOC Initial Note (Signed)
Transition of Care [facility]) - Initial/Assessment Note    Patient Details  Name: Victoria Wilkerson MRN: [id number] Date of Birth: [birth year]  Transition of [facility]) CM/SW Contact:    Shelbie Hutching, RN Phone Number: 09/25/2019, 4:15 PM  Clinical Narrative:                 Patient admitted with pneumonia due to [place] 19.  Patient is from home where she lives with her daughter, [name].  [name] reports that patient does have some dementia but that she is independent in ADL's and enjoys walking outside.  Patient requires not assistive devices at home and will need a walker and bedside commode at discharge.  [employer] will provide equipment and deliver to the room before discharge.  Patient's daughter is also agreeable to home health services.  [place] with [facility] has accepted referral for home health PT, OT, aide and SW.   RNCM will cont to follow.   Expected Discharge Plan: [place] Barriers to Discharge: Continued Medical Work up   Patient Goals and CMS Choice Patient states their goals for this hospitalization and ongoing recovery are:: daughter would like for the patient to return home with home health services CMS Medicare.gov Compare Post Acute Care list provided to:: Patient Represenative (must comment) Choice offered to / list presented to : Adult Children(daughter [name])  Expected Discharge Plan and Services Expected Discharge Plan: [place]   Discharge Planning Services: CM Consult Post Acute Care Choice: [place] arrangements for the past 2 months: Single Family Home                 DME Arranged: Walker rolling, 3-N-1 DME Agency: [facility] Date DME Agency Contacted: 09/25/19 Time DME Agency Contacted: [phone] Representative spoke with at DME Agency: [place]: PT, OT, Nurse's Aide, Social Work [employer] Agency: [facility] [place] Date [place]: 09/25/19 Time [place]: [phone] Representative spoke with at [place]: Jana Half  Prior Living Arrangements/Services Living arrangements for the past 2 months: [place] with:: Adult Children(daughter [place]) Patient language and need for interpreter reviewed:: Yes Do you feel safe going back to the place where you live?: Yes      Need for Family Participation in Patient Care: Yes (Comment)(dementia, COVID) Care giver support system in place?: Yes (comment)(daughter)   Criminal Activity/Legal Involvement Pertinent to Current Situation/Hospitalization: No - Comment as needed  Activities of Daily Living Home Assistive Devices/Equipment: None ADL Screening (condition at time of admission) Patient's cognitive ability adequate to safely complete daily activities?: No Is the patient deaf or have difficulty hearing?: No Does the patient have difficulty seeing, even when wearing glasses/contacts?: Yes Does the patient have difficulty concentrating, remembering, or making decisions?: Yes Patient able to express need for assistance with ADLs?: No Does the patient have difficulty dressing or bathing?: No Independently performs ADLs?: Yes (appropriate for developmental age) Does the patient have difficulty walking or climbing stairs?: No Weakness of Legs: None Weakness of Arms/Hands: None  Permission Sought/Granted Permission sought to share information with : Case Manager, Family Supports, Other (comment) Permission granted to share information with : Yes, Verbal Permission Granted  Share Information with NAME: [name]  Permission granted to share info w AGENCY: [facility] [place], [employer]  Permission granted to share info w Relationship: daughter     Emotional Assessment       Orientation: : Oriented to Self, Oriented to Place Alcohol / Substance Use: Not  Applicable Psych Involvement: No (comment)  Admission diagnosis:  Encephalopathy [G93.40] Acute cystitis without hematuria [N30.00] Multifocal pneumonia [J18.9] Pneumonia due to  COVID-19 virus [U07.1, J12.82] Patient Active Problem List   Diagnosis Date Noted  . Pneumonia due to COVID-19 virus 09/23/2019  . Hypertension   . Sepsis ([place])   . UTI (urinary tract infection)   . Adrenal adenoma-right   . Acute renal failure superimposed on stage 3a chronic kidney disease ([place])   . Mild dementia [facility])    PCP:  [facility] [place]:   [place], [place] [address] Phone: [phone] Fax: [phone]     Social Determinants of Health (SDOH) Interventions    Readmission Risk Interventions No flowsheet data found.

## 2019-09-25 NOTE — Progress Notes (Addendum)
Initial Nutrition Assessment  RD working remotely.  DOCUMENTATION CODES:   Obesity unspecified  INTERVENTION:   - Recommend liberalizing diet to Regular, discussed with MD who agreed and verbal with readback order placed  - Ensure Enlive po BID, each supplement provides 350 kcal and 20 grams of protein  - MVI with minerals daily  - Encourage adequate PO intake  NUTRITION DIAGNOSIS:   Increased nutrient needs related to acute illness (COVID-19 pneumonia, sepsis) as evidenced by estimated needs.  GOAL:   Patient will meet greater than or equal to 90% of their needs  MONITOR:   PO intake, Supplement acceptance, Labs, Weight trends  REASON FOR ASSESSMENT:   Malnutrition Screening Tool    ASSESSMENT:   74 year old female who presented with cough, fever, chills. PMH of HTN, TIA, CKD stage III, dementia. Admitted with pneumonia due to COVID-19, sepsis.   Spoke with pt via phone call to room. Pt reports having a good appetite but is a poor historian. Pt unable to recall what she typically eats or what her UBW is. Pt willing to consume an oral nutrition supplement during admission. RD to order. Will also order daily MVI with minerals.  Weight of 160 lbs on admission appears stated rather than measured. Current weight is about 10 kg below weight in October 2018. Unable to determine timeframe of weight loss given limited weight history in chart.  Meal Completion: 100%  Medications reviewed and include: vitamin C, zinc sulfate, IV abx, remdesivir  Labs reviewed: potassium 3.4  NUTRITION - FOCUSED PHYSICAL EXAM:  Unable to complete at this time. RD working remotely.  Diet Order:   Diet Order            Diet Heart Room service appropriate? Yes; Fluid consistency: Thin  Diet effective now              EDUCATION NEEDS:   No education needs have been identified at this time  Skin:  Skin Assessment: Reviewed RN Assessment  Last BM:  09/25/19 medium type 6  Height:    Ht Readings from Last 1 Encounters:  09/23/19 5\' 1"  (1.549 m)    Weight:   Wt Readings from Last 1 Encounters:  09/23/19 72.6 kg    Ideal Body Weight:  47.7 kg  BMI:  Body mass index is 30.23 kg/m.  Estimated Nutritional Needs:   Kcal:  1600-1800  Protein:  80-95 grams  Fluid:  1.6-1.8 L    Gaynell Face, MS, RD, LDN Inpatient Clinical Dietitian Pager: [phone] Weekend/After Hours: [phone]

## 2019-09-25 NOTE — Progress Notes (Signed)
PROGRESS NOTE    Victoria Wilkerson  [id number] DOB: Feb 27, 1946 DOA: 09/23/2019 PCP: [facility] [place]    Brief Narrative:  Victoria Wilkerson is a 74 y.o. female with medical history significant of hypertension, TIA, CKD-3, mild dementia, who presents with cough, generalized weakness, fever, nausea, vomiting and abdominal pain.  Per her daughter (I called her daughter by phone), patient has been increasingly weak in the past several days.  Patient has cough, fever and chills. Patient denies chest pain.  No respiratory distress.  She also has nausea, vomiting and some mild abdominal pain.  No diarrhea.  Not sure if patient has symptoms of UTI.  She moves all extremities.  Patient was treated for UTI approximately 4 weeks ago per her daughter.  ED Course: pt was found to have WBC 4.1, lipase 48, positive Covid Ag test, positive urinalysis (cloudy appearance, large amount of leukocyte, negative bacteria, WBC >50), worsening renal function, temperature 100.7, blood pressure 131/70, heart rate 90s, oxygen saturation 95% on room air.  Chest x-ray negative.  CT abdomen/pelvis showed multifocal pneumonia and 1.6 cm right adrenal adenoma.  Patient is placed on MedSurg bed for observation   Consultants:       Antimicrobials:   Ceftriaxone, azithromycin, remdesivir   Subjective: Pt at baseline confusion, but interactive, quite. Sitter at bedside. Pt has no complaints.     Objective: Vitals:   09/25/19 0830 09/25/19 0831 09/25/19 0845 09/25/19 1345  BP: 136/73  (!) 144/92 139/67  Pulse: 67 70 68 72  Resp: 15 14 18 19   Temp:  97.8 F (36.6 C)  98.6 F (37 C)  TempSrc:  Oral  Oral  SpO2: 99% 97% 94% 100%  Weight:      Height:        Intake/Output Summary (Last 24 hours) at 09/25/2019 1731 Last data filed at 09/25/2019 1230 Gross per 24 hour  Intake 480 ml  Output --  Net 480 ml   Filed Weights   09/23/19 0919  Weight: 72.6 kg    Examination:  General  exam: Appears calm and comfortable, NAD Respiratory system: More clear to auscultation, no wheezing Cardiovascular system: S1 & S2 heard, RRR. No JVD, murmurs, rubs, gallops or clicks.  Gastrointestinal system: Abdomen is nondistended, soft and nontender.. Normal bowel sounds heard. Central nervous system: Alert and oriented x1 , grossly intact  extremities: No edema Skin: Warm dry Psychiatry:  Mood & affect appropriate.  Baseline confusion    Data Reviewed: I have personally reviewed following labs and imaging studies  CBC: Recent Labs  Lab 09/23/19 0921 09/24/19 0500 09/25/19 0536  WBC 4.1 4.5 3.4*  NEUTROABS  --  3.0 1.5*  HGB 13.5 13.3 12.7  HCT 42.0 41.1 39.3  MCV 88.8 89.2 88.7  PLT 220 232 [id number]   Basic Metabolic Panel: Recent Labs  Lab 09/23/19 0921 09/23/19 2300 09/24/19 0500 09/25/19 0536  NA 137  --  139 142  K 3.5  --  3.5 3.4*  CL 102  --  107 113*  CO2 25  --  21* 23  GLUCOSE 107*  --  89 77  BUN 47*  --  42* 34*  CREATININE 1.79*  --  1.39* 0.96  CALCIUM 8.6*  --  8.4* 8.6*  MG  --  2.1  --   --    GFR: Estimated Creatinine Clearance: 49 mL/min (by C-G formula based on SCr of 0.96 mg/dL). Liver Function Tests: Recent Labs  Lab 09/23/19 0921 09/24/19 0500  09/25/19 0536  AST 40 41 38  ALT 24 21 18   ALKPHOS 66 60 57  BILITOT 0.8 0.6 0.4  PROT 8.7* 8.1 7.8  ALBUMIN 3.9 3.5 3.3*   Recent Labs  Lab 09/23/19 0921  LIPASE 48   No results for input(s): AMMONIA in the last 168 hours. Coagulation Profile: No results for input(s): INR, PROTIME in the last 168 hours. Cardiac Enzymes: No results for input(s): CKTOTAL, CKMB, CKMBINDEX, TROPONINI in the last 168 hours. BNP (last 3 results) No results for input(s): PROBNP in the last 8760 hours. HbA1C: No results for input(s): HGBA1C in the last 72 hours. CBG: No results for input(s): GLUCAP in the last 168 hours. Lipid Profile: Recent Labs    09/23/19 2028  TRIG 93   Thyroid Function  Tests: No results for input(s): TSH, T4TOTAL, FREET4, T3FREE, THYROIDAB in the last 72 hours. Anemia Panel: Recent Labs    09/24/19 0500 09/25/19 0536  FERRITIN 360* 372*   Sepsis Labs: Recent Labs  Lab 09/23/19 0931 09/23/19 1318 09/23/19 2028 09/23/19 2300  PROCALCITON <0.10  --   --   --   LATICACIDVEN  --  0.9 2.0* 0.9    Recent Results (from the past 240 hour(s))  Urine Culture     Status: Abnormal   Collection Time: 09/23/19 11:42 AM   Specimen: Urine, Random  Result Value Ref Range Status   Specimen Description   Final    URINE, RANDOM Performed at [facility], [address], [place], [place] [address]    Special Requests   Final    NONE Performed at [facility], [address] [place], [place] [address]    Culture MULTIPLE SPECIES PRESENT, SUGGEST RECOLLECTION (A)  Final   Report Status 09/24/2019 FINAL  Final  SARS Coronavirus 2 by RT PCR (hospital order, performed in [place] hospital lab) Nasopharyngeal Nasopharyngeal Swab     Status: Abnormal   Collection Time: 09/23/19 12:42 PM   Specimen: Nasopharyngeal Swab  Result Value Ref Range Status   SARS Coronavirus 2 POSITIVE (A) NEGATIVE Final    Comment: RESULT CALLED TO, READ BACK BY AND VERIFIED WITH: DEE MCCLAIN RN AT [id number] ON 09/23/19 SNG (NOTE) SARS-CoV-2 target nucleic acids are DETECTED SARS-CoV-2 RNA is generally detectable in upper respiratory specimens  during the acute phase of infection.  Positive results are indicative  of the presence of the identified virus, but do not rule out bacterial infection or co-infection with other pathogens not detected by the test.  Clinical correlation with patient history and  other diagnostic information is necessary to determine patient infection status.  The expected result is negative. Fact Sheet for Patients:   StrictlyIdeas.no  Fact Sheet for Healthcare Providers:    BankingDealers.co.za   This test is not yet approved or cleared by the [place] FDA and  has been authorized for detection and/or diagnosis of SARS-CoV-2 by FDA under an Emergency Use Authorization (EUA).  This EUA will remain in effect (meaning this test ca n be used) for the duration of  the COVID-19 declaration under Section 564(b)(1) of the Act, 21 U.S.C. section 360-bbb-3(b)(1), unless the authorization is terminated or revoked sooner. Performed at [facility], [address] [place], [place] [address]   Blood culture (routine x 2)     Status: None (Preliminary result)   Collection Time: 09/23/19  1:17 PM   Specimen: BLOOD  Result Value Ref Range Status   Specimen Description BLOOD RIGHT AC  Final   Special Requests   Final    BOTTLES DRAWN AEROBIC AND ANAEROBIC Blood Culture results may not be optimal due to an inadequate volume of blood received in culture bottles   Culture   Final    NO GROWTH 2 DAYS Performed at [facility], [address], [place], [place] [address]    Report Status PENDING  Incomplete  Blood culture (routine x 2)     Status: None (Preliminary result)   Collection Time: 09/23/19  1:17 PM   Specimen: BLOOD  Result Value Ref Range Status   Specimen Description BLOOD RIGHT FA  Final   Special Requests   Final    BOTTLES DRAWN AEROBIC AND ANAEROBIC Blood Culture results may not be optimal due to an inadequate volume of blood received in culture bottles   Culture   Final    NO GROWTH 2 DAYS Performed at [facility], [address], [place], [place] [address]    Report Status PENDING  Incomplete         Radiology Studies: No results found.      Scheduled Meds: . vitamin C  500 mg Oral Daily  . enoxaparin (LOVENOX) injection  40 mg Subcutaneous Q24H  . feeding supplement (ENSURE ENLIVE)  237 mL Oral BID BM  . [START ON 09/26/2019] multivitamin with minerals  1 tablet Oral Daily  .  pneumococcal 23 valent vaccine  0.5 mL Intramuscular Tomorrow-1000  . zinc sulfate  220 mg Oral Daily   Continuous Infusions: . azithromycin (ZITHROMAX) 500 MG IVPB (Vial-Mate [employer]) 500 mg (09/25/19 1643)  . cefTRIAXone (ROCEPHIN)  IV 2 g (09/25/19 1336)  . remdesivir 100 mg in NS 100 mL 100 mg (09/25/19 1027)    Assessment & Plan:   Principal Problem:   Pneumonia due to COVID-19 virus Active Problems:   Hypertension   Sepsis ([place])   UTI (urinary tract infection)   Adrenal adenoma-right   Acute renal failure superimposed on stage 3a chronic kidney disease ([place])   Pneumonia due to COVID-19 virus: CT scan showed multifocal infiltration. . Continue Remdesivir, ceftriaxone, azithromycin -vitamin C, zinc.  -prn albuterol inhaler -PRN Mucinex for cough -f/u Blood culture, pending.  Urine culture multiple species needs recollection -Gentle IV fluid -D-dimer, BNP,Trop, LFT, CRP, LDH, Procalcitonin, Ferritin, fibinogen, TG, Hep B SAg, HIV ab -Daily CRP, Ferritin, D-dimer,  maintain euvolemia to a net negative fluid status Wbc down mildly need to monitor   UTI (urinary tract infection): -continue for now rocephin -f/u urine culture needs recollection  Sepsis due to  and pneumonia secondary to COVID-19 infection: Patient meets critical for sepsis with fever, tachycardia. elevated lactic acid  Currently hemodynamically stable CRP decreasing Thick acid improved with IV fluids  Procalcitonin and trend lactic acid levels per sepsis protocol. D/c ivf  HTN:  --hold HCTZ and Cozaar due to worsening renal function -hydralazine prn-we will DC, start amlodipine 5 mg daily  Adrenal adenoma-right: Incidental finding on CT scan -f/u with PCP  Acute renal failure superimposed on stage 3a chronic kidney disease ([place]): Baseline creatinine 1.04 on on 02/01/2017.   Improved with iv hydration  likely due to prerenal/sepsis and continuation of Cozaar and HCTZ -Hold Cozaar and  HCTZ Will start    Dementia- at baseline when I spoke to her daughter. Pt has good and bad days. At times not oriented at home.    DVT ppx: SQ Lovenox Code Status: Full code Family Communication: Spoke to daughter-via phone Disposition Plan:  Anticipate discharge back to previous home environment Barrier: Patient medically not stable to be discharged home, and IV antibiotics.  Possible DC in 1 to 2 days, if medically stable will dc in am     LOS: 1 day   Time spent: 45 minutes with more than 50% on [place], MD [facility] Pager [phone]  If 7PM-7AM, please contact night-coverage [email or web address] Password [facility] 09/25/2019, 5:31 PM Patient ID: Kaysen Tukes, female   DOB: 1945-12-16, 74 y.o.   MRN: [id number]

## 2019-09-26 LAB — CBC WITH DIFFERENTIAL/PLATELET
Abs Immature Granulocytes: 0.03 10*3/uL (ref 0.00–0.07)
Basophils Absolute: 0 10*3/uL (ref 0.0–0.1)
Basophils Relative: 1 %
Eosinophils Absolute: 0.1 10*3/uL (ref 0.0–0.5)
Eosinophils Relative: 3 %
HCT: 40.7 % (ref 36.0–46.0)
Hemoglobin: 13.6 g/dL (ref 12.0–15.0)
Immature Granulocytes: 1 %
Lymphocytes Relative: 47 %
Lymphs Abs: 1.5 10*3/uL (ref 0.7–4.0)
MCH: 29.1 pg (ref 26.0–34.0)
MCHC: 33.4 g/dL (ref 30.0–36.0)
MCV: 87 fL (ref 80.0–100.0)
Monocytes Absolute: 0.3 10*3/uL (ref 0.1–1.0)
Monocytes Relative: 10 %
Neutro Abs: 1.2 10*3/uL — ABNORMAL LOW (ref 1.7–7.7)
Neutrophils Relative %: 38 %
Platelets: 252 10*3/uL (ref 150–400)
RBC: 4.68 MIL/uL (ref 3.87–5.11)
RDW: 12.9 % (ref 11.5–15.5)
Smear Review: NORMAL
WBC: 3.2 10*3/uL — ABNORMAL LOW (ref 4.0–10.5)
nRBC: 0 % (ref 0.0–0.2)

## 2019-09-26 LAB — COMPREHENSIVE METABOLIC PANEL
ALT: 19 U/L (ref 0–44)
AST: 40 U/L (ref 15–41)
Albumin: 3 g/dL — ABNORMAL LOW (ref 3.5–5.0)
Alkaline Phosphatase: 57 U/L (ref 38–126)
Anion gap: 6 (ref 5–15)
BUN: 22 mg/dL (ref 8–23)
CO2: 23 mmol/L (ref 22–32)
Calcium: 8.4 mg/dL — ABNORMAL LOW (ref 8.9–10.3)
Chloride: 112 mmol/L — ABNORMAL HIGH (ref 98–111)
Creatinine, Ser: 0.77 mg/dL (ref 0.44–1.00)
GFR calc Af Amer: >60 mL/min (ref >60)
GFR calc non Af Amer: >60 mL/min (ref >60)
Glucose, Bld: 77 mg/dL (ref 70–99)
Potassium: 3.7 mmol/L (ref 3.5–5.1)
Sodium: 141 mmol/L (ref 135–145)
Total Bilirubin: 0.6 mg/dL (ref 0.3–1.2)
Total Protein: 7.2 g/dL (ref 6.5–8.1)

## 2019-09-26 LAB — FIBRIN DERIVATIVES D-DIMER (ARMC ONLY): Fibrin derivatives D-dimer (ARMC): 1311.59 ng/mL (FEU) — ABNORMAL HIGH (ref 0.00–499.00)

## 2019-09-26 LAB — C-REACTIVE PROTEIN: CRP: 3.7 mg/dL — ABNORMAL HIGH (ref <1.0)

## 2019-09-26 LAB — FERRITIN: Ferritin: 380 ng/mL — ABNORMAL HIGH (ref 11–307)

## 2019-09-26 MED ORDER — QUETIAPINE FUMARATE 25 MG PO TABS
25.0000 mg | ORAL_TABLET | Freq: Once | ORAL | Status: AC
  Administered 2019-09-26: 19:00:00 25 mg via ORAL
  Filled 2019-09-26: qty 1

## 2019-09-26 MED ORDER — AMLODIPINE BESYLATE 5 MG PO TABS
5.0000 mg | ORAL_TABLET | Freq: Once | ORAL | Status: AC
  Administered 2019-09-26: 5 mg via ORAL
  Filled 2019-09-26: qty 1

## 2019-09-26 MED ORDER — SODIUM CHLORIDE 0.9 % IV SOLN: INTRAVENOUS | Status: DC

## 2019-09-26 MED ORDER — AMLODIPINE BESYLATE 10 MG PO TABS
10.0000 mg | ORAL_TABLET | Freq: Every day | ORAL | Status: DC
  Administered 2019-09-27: 11:00:00 10 mg via ORAL
  Filled 2019-09-26: qty 1

## 2019-09-26 NOTE — Progress Notes (Signed)
PROGRESS NOTE    [name]  [id number] DOB: Jun 08, 1945 DOA: 09/23/2019 PCP: [facility] [place]    Brief Narrative:  Victoria Wilkerson is a 74 y.o. female with medical history significant of hypertension, TIA, CKD-3, mild dementia, who presents with cough, generalized weakness, fever, nausea, vomiting and abdominal pain.  Per her daughter (I called her daughter by phone), patient has been increasingly weak in the past several days.  Patient has cough, fever and chills. Patient denies chest pain.  No respiratory distress.  She also has nausea, vomiting and some mild abdominal pain.  No diarrhea.  Not sure if patient has symptoms of UTI.  She moves all extremities.  Patient was treated for UTI approximately 4 weeks ago per her daughter.  ED Course: pt was found to have WBC 4.1, lipase 48, positive Covid Ag test, positive urinalysis (cloudy appearance, large amount of leukocyte, negative bacteria, WBC >50), worsening renal function, temperature 100.7, blood pressure 131/70, heart rate 90s, oxygen saturation 95% on room air.  Chest x-ray negative.  CT abdomen/pelvis showed multifocal pneumonia and 1.6 cm right adrenal adenoma.  Patient is placed on MedSurg bed for observation   Consultants:       Antimicrobials:   Ceftriaxone, azithromycin, remdesivir   Subjective: Pt at baseline confusion.  Sitter at bedside.  Patient has no complaints of shortness of breath, chest pain, abdominal pain or diarrhea.    Objective: Vitals:   09/25/19 1345 09/25/19 2329 09/26/19 0745 09/26/19 1300  BP: 139/67 (!) 177/80 (!) 145/77 134/74  Pulse: 72 80 76 80  Resp: 19 16 15 13   Temp: 98.6 F (37 C) 97.8 F (36.6 C) 98.5 F (36.9 C) 98.6 F (37 C)  TempSrc: Oral Oral Oral Oral  SpO2: 100% 100% 98% 99%  Weight:      Height:        Intake/Output Summary (Last 24 hours) at 09/26/2019 1553 Last data filed at 09/26/2019 0930 Gross per 24 hour  Intake 1198.93 ml  Output --   Net 1198.93 ml   Filed Weights   09/23/19 0919  Weight: 72.6 kg    Examination:  General exam: Appears calm and comfortable, NAD, sitter at bedside Respiratory system: Clear to auscultation, good respiratory effort.  No wheezing or rhonchi's Cardiovascular system: S1 & S2 heard, RRR. No JVD, murmurs, rubs, gallops or clicks.  Gastrointestinal system: Abdomen is nondistended, soft and nontender.. Normal bowel sounds heard. Central nervous system: Alert and oriented x1 which is person not oriented to place or time, grossly intact  extremities: No edema Skin: Warm dry Psychiatry:  Mood & affect appropriate.  Baseline confusion    Data Reviewed: I have personally reviewed following labs and imaging studies  CBC: Recent Labs  Lab 09/23/19 0921 09/24/19 0500 09/25/19 0536 09/26/19 0542  WBC 4.1 4.5 3.4* 3.2*  NEUTROABS  --  3.0 1.5* 1.2*  HGB 13.5 13.3 12.7 13.6  HCT 42.0 41.1 39.3 40.7  MCV 88.8 89.2 88.7 87.0  PLT 220 232 229 [id number]   Basic Metabolic Panel: Recent Labs  Lab 09/23/19 0921 09/23/19 2300 09/24/19 0500 09/25/19 0536 09/26/19 0542  NA 137  --  139 142 141  K 3.5  --  3.5 3.4* 3.7  CL 102  --  107 113* 112*  CO2 25  --  21* 23 23  GLUCOSE 107*  --  89 77 77  BUN 47*  --  42* 34* 22  CREATININE 1.79*  --  1.39* 0.96 0.77  CALCIUM 8.6*  --  8.4* 8.6* 8.4*  MG  --  2.1  --   --   --    GFR: Estimated Creatinine Clearance: 58.8 mL/min (by C-G formula based on SCr of 0.77 mg/dL). Liver Function Tests: Recent Labs  Lab 09/23/19 0921 09/24/19 0500 09/25/19 0536 09/26/19 0542  AST 40 41 38 40  ALT 24 21 18 19   ALKPHOS 66 60 57 57  BILITOT 0.8 0.6 0.4 0.6  PROT 8.7* 8.1 7.8 7.2  ALBUMIN 3.9 3.5 3.3* 3.0*   Recent Labs  Lab 09/23/19 0921  LIPASE 48   No results for input(s): AMMONIA in the last 168 hours. Coagulation Profile: No results for input(s): INR, PROTIME in the last 168 hours. Cardiac Enzymes: No results for input(s): CKTOTAL, CKMB,  CKMBINDEX, TROPONINI in the last 168 hours. BNP (last 3 results) No results for input(s): PROBNP in the last 8760 hours. HbA1C: No results for input(s): HGBA1C in the last 72 hours. CBG: No results for input(s): GLUCAP in the last 168 hours. Lipid Profile: Recent Labs    09/23/19 2028  TRIG 93   Thyroid Function Tests: No results for input(s): TSH, T4TOTAL, FREET4, T3FREE, THYROIDAB in the last 72 hours. Anemia Panel: Recent Labs    09/25/19 0536 09/26/19 0542  FERRITIN 372* 380*   Sepsis Labs: Recent Labs  Lab 09/23/19 0931 09/23/19 1318 09/23/19 2028 09/23/19 2300  PROCALCITON <0.10  --   --   --   LATICACIDVEN  --  0.9 2.0* 0.9    Recent Results (from the past 240 hour(s))  Urine Culture     Status: Abnormal   Collection Time: 09/23/19 11:42 AM   Specimen: Urine, Random  Result Value Ref Range Status   Specimen Description   Final    URINE, RANDOM Performed at [facility], [address], [place], [place] [address]    Special Requests   Final    NONE Performed at [facility], [address] [place], [place] [address]    Culture MULTIPLE SPECIES PRESENT, SUGGEST RECOLLECTION (A)  Final   Report Status 09/24/2019 FINAL  Final  SARS Coronavirus 2 by RT PCR (hospital order, performed in [place] hospital lab) Nasopharyngeal Nasopharyngeal Swab     Status: Abnormal   Collection Time: 09/23/19 12:42 PM   Specimen: Nasopharyngeal Swab  Result Value Ref Range Status   SARS Coronavirus 2 POSITIVE (A) NEGATIVE Final    Comment: RESULT CALLED TO, READ BACK BY AND VERIFIED WITH: DEE MCCLAIN RN AT [id number] ON 09/23/19 SNG (NOTE) SARS-CoV-2 target nucleic acids are DETECTED SARS-CoV-2 RNA is generally detectable in upper respiratory specimens  during the acute phase of infection.  Positive results are indicative  of the presence of the identified virus, but do not rule out bacterial infection or co-infection with other pathogens not detected by  the test.  Clinical correlation with patient history and  other diagnostic information is necessary to determine patient infection status.  The expected result is negative. Fact Sheet for Patients:   StrictlyIdeas.no  Fact Sheet for Healthcare Providers:   BankingDealers.co.za   This test is not yet approved or cleared by the [place] FDA and  has been authorized for detection and/or diagnosis of SARS-CoV-2 by FDA under an Emergency Use Authorization (EUA).  This EUA will remain in effect (meaning this test ca n be used) for the duration of  the COVID-19 declaration under Section 564(b)(1) of the Act, 21 U.S.C. section 360-bbb-3(b)(1), unless the  authorization is terminated or revoked sooner. Performed at [facility], [address] [place], [place] [address]   Blood culture (routine x 2)     Status: None (Preliminary result)   Collection Time: 09/23/19  1:17 PM   Specimen: BLOOD  Result Value Ref Range Status   Specimen Description BLOOD RIGHT [facility]  Final   Special Requests   Final    BOTTLES DRAWN AEROBIC AND ANAEROBIC Blood Culture results may not be optimal due to an inadequate volume of blood received in culture bottles   Culture   Final    NO GROWTH 3 DAYS Performed at [facility], [address] [place], [place] [address]    Report Status PENDING  Incomplete  Blood culture (routine x 2)     Status: None (Preliminary result)   Collection Time: 09/23/19  1:17 PM   Specimen: BLOOD  Result Value Ref Range Status   Specimen Description BLOOD RIGHT FA  Final   Special Requests   Final    BOTTLES DRAWN AEROBIC AND ANAEROBIC Blood Culture results may not be optimal due to an inadequate volume of blood received in culture bottles   Culture   Final    NO GROWTH 3 DAYS Performed at [facility], [address] [place],  [address]    Report Status PENDING  Incomplete         Radiology  Studies: No results found.      Scheduled Meds: . amLODipine  5 mg Oral Daily  . vitamin C  500 mg Oral Daily  . enoxaparin (LOVENOX) injection  40 mg Subcutaneous Q24H  . feeding supplement (ENSURE ENLIVE)  237 mL Oral BID BM  . multivitamin with minerals  1 tablet Oral Daily  . zinc sulfate  220 mg Oral Daily   Continuous Infusions: . sodium chloride 10 mL/hr at 09/26/19 1128  . cefTRIAXone (ROCEPHIN)  IV 2 g (09/26/19 1130)  . remdesivir 100 mg in NS 100 mL 100 mg (09/26/19 1126)    Assessment & Plan:   Principal Problem:   Pneumonia due to COVID-19 virus Active Problems:   Hypertension   Sepsis ([place])   UTI (urinary tract infection)   Adrenal adenoma-right   Acute renal failure superimposed on stage 3a chronic kidney disease ([place])   Pneumonia due to COVID-19 virus: CT scan showed multifocal infiltration. . Continue Remdesivir, ceftriaxone, azithromycin -vitamin C, zinc.  -prn albuterol inhaler -PRN Mucinex for cough -f/u Blood culture, pending.  Urine culture multiple species needs recollection -Gentle IV fluid -D-dimer, BNP,Trop, LFT, CRP, LDH, Procalcitonin, Ferritin, fibinogen, TG, Hep B SAg, HIV ab -Daily CRP, Ferritin...> Decreasing  maintain euvolemia to a net negative fluid status Wbc down mildly need to monitor.   UTI (urinary tract infection): -continue for now rocephin -f/u urine culture needs recollection  Sepsis due to  and pneumonia secondary to COVID-19 infection: Patient meets critical for sepsis with fever, tachycardia. elevated lactic acid  Currently hemodynamically stable CRP decreasing Thick acid improved with IV fluids  Procalcitonin and trend lactic acid levels per sepsis protocol. D/c ivf  HTN:  --hold HCTZ and Cozaar due to worsening renal function -hydralazine prn-we will DC, started amlodipine 5 mg daily, increase to 10 mg.  Adrenal adenoma-right: Incidental finding on CT scan -f/u with PCP  Acute renal failure  superimposed on stage 3a chronic kidney disease ([place]): Baseline creatinine 1.04 on on 02/01/2017.   Improved with iv hydration  likely due to prerenal/sepsis and continuation of  Cozaar and HCTZ -Hold Cozaar and HCTZ    Dementia- at baseline when I spoke to her daughter. Pt has good and bad days. At times not oriented at home.    DVT ppx: SQ Lovenox Code Status: Full code Family Communication: Spoke to daughter-via phone Disposition Plan:  Anticipate discharge back to previous home environment Barrier: Patient medically not stable to be discharged home, and IV antibiotics.  Possible DC in 1 to 2 days, if medically stable will dc in am     LOS: 2 days   Time spent: 45 minutes with more than 50% on [place], MD [facility] Pager [phone]  If 7PM-7AM, please contact night-coverage [email or web address] Password [facility] 09/26/2019, 3:53 PM Patient ID: Darlyn Reinard, female   DOB: 1946-03-06, 74 y.o.   MRN: [id number]

## 2019-09-26 NOTE — Progress Notes (Signed)
Physical Therapy Treatment Patient Details Name: Victoria Wilkerson MRN: [id number] DOB: 1945-07-03 Today's Date: 09/26/2019    History of Present Illness Pt is a 74 y.o. female presenting to hospital with N/V, weakness, cough, and abdominal pain.  Admitted with PNA d/t COVID-19 virus, UTI, and sepsis.  PMH includes htn, stroke, and dementia.    PT Comments    Pt resting in bed upon PT arrival; pt intermittently tearful during session d/t wanting to go home (nurse notified)--therapist provided supportive listening and improved mood noted.  Pt modified independent with bed mobility; CGA with transfers; and CGA to SBA ambulating 120 feet with RW in room.  Overall pt steady ambulating with RW; no SOB noted with activity; and HR and O2 sats WFL on room air during sessions activities.  RW for home use delivered to pt's room already and therapist adjusted walker height for pt use.  Will continue to focus on strengthening, balance, and progressive functional mobility during hospitalization.    Follow Up Recommendations  Home health PT;Supervision for mobility/OOB     Equipment Recommendations  Rolling walker with 5" wheels;3in1 (PT)    Recommendations for Other Services OT consult     Precautions / Restrictions Precautions Precautions: Fall Restrictions Weight Bearing Restrictions: No    Mobility  Bed Mobility Overal bed mobility: Modified Independent             General bed mobility comments: Supine to/from sit with mild increased effort to perform on own; able to reposition self in bed end of session  Transfers Overall transfer level: Needs assistance Equipment used: Rolling walker (2 wheeled) Transfers: Sit to/from Stand Sit to Stand: Min guard         General transfer comment: fairly strong stand noted from bed up to RW; controlled descent noted sitting on bed end of ambulation  Ambulation/Gait Ambulation/Gait assistance: Min guard;Supervision Gait Distance (Feet): 120  Feet Assistive device: Rolling walker (2 wheeled)   Gait velocity: mildly decreased   General Gait Details: partial step through gait pattern; steady with RW use   Stairs             Wheelchair Mobility    Modified Rankin (Stroke Patients Only)       Balance Overall balance assessment: Needs assistance Sitting-balance support: No upper extremity supported;Feet supported Sitting balance-Leahy Scale: Normal Sitting balance - Comments: steady sitting reaching outside BOS     Standing balance-Leahy Scale: Fair Standing balance comment: pt requiring at least single UE support for static standing balance                            Cognition Arousal/Alertness: Awake/alert Behavior During Therapy: WFL for tasks assessed/performed Overall Cognitive Status: No family/caregiver present to determine baseline cognitive functioning                                 General Comments: Pt oriented to self and hospital; pt tearful at times during session d/t wanting to go home (therapist provided supportive listening; nurse notified)      Exercises      General Comments   Nursing cleared pt for participation in physical therapy.  Pt agreeable to PT session.      Pertinent Vitals/Pain Pain Assessment: Faces Faces Pain Scale: No hurt Pain Intervention(s): Limited activity within patient's tolerance;Monitored during session;Repositioned    Home Living  Prior Function            PT Goals (current goals can now be found in the care plan section) Acute Rehab PT Goals Patient Stated Goal: to go home and go to church PT Goal Formulation: With patient Time For Goal Achievement: 10/08/19 Potential to Achieve Goals: Good Progress towards PT goals: Progressing toward goals    Frequency    Min 2X/week      PT Plan Current plan remains appropriate    Co-evaluation              AM-PAC PT "6 Clicks" Mobility    Outcome Measure  Help needed turning from your back to your side while in a flat bed without using bedrails?: None Help needed moving from lying on your back to sitting on the side of a flat bed without using bedrails?: None Help needed moving to and from a bed to a chair (including a wheelchair)?: A Little Help needed standing up from a chair using your arms (e.g., wheelchair or bedside chair)?: A Little Help needed to walk in hospital room?: A Little Help needed climbing 3-5 steps with a railing? : A Little 6 Click Score: 20    End of Session Equipment Utilized During Treatment: Gait belt Activity Tolerance: Patient tolerated treatment well Patient left: in bed;with call bell/phone within reach;with bed alarm set;Other (comment)(bed in lowest position) Nurse Communication: Mobility status;Precautions;Other (comment)(pt tearful at times during session d/t wanting to go home) PT Visit Diagnosis: Unsteadiness on feet (R26.81);Muscle weakness (generalized) (M62.81);Other abnormalities of gait and mobility (R26.89)     Time: [id number] PT Time Calculation (min) (ACUTE ONLY): 38 min  Charges:  $Gait Training: 8-22 mins $Therapeutic Exercise: 8-22 mins $Therapeutic Activity: 8-22 mins                     Leitha Bleak, PT 09/26/19, 4:10 PM

## 2019-09-26 NOTE — Care Management Important Message (Signed)
Important Message  Patient Details  Name: Victoria Wilkerson MRN: [id number] Date of Birth: 11-21-1945   Medicare Important Message Given:  Yes  Initial Medicare IM given by Patient Access Associate on 09/25/2019 at 2:24pm.     Dannette Barbara 09/26/2019, 8:33 AM

## 2019-09-27 LAB — CBC WITH DIFFERENTIAL/PLATELET
Abs Immature Granulocytes: 0.02 10*3/uL (ref 0.00–0.07)
Basophils Absolute: 0 10*3/uL (ref 0.0–0.1)
Basophils Relative: 1 %
Eosinophils Absolute: 0.1 10*3/uL (ref 0.0–0.5)
Eosinophils Relative: 3 %
HCT: 40.8 % (ref 36.0–46.0)
Hemoglobin: 13.5 g/dL (ref 12.0–15.0)
Immature Granulocytes: 1 %
Lymphocytes Relative: 38 %
Lymphs Abs: 1.6 10*3/uL (ref 0.7–4.0)
MCH: 28.3 pg (ref 26.0–34.0)
MCHC: 33.1 g/dL (ref 30.0–36.0)
MCV: 85.5 fL (ref 80.0–100.0)
Monocytes Absolute: 0.4 10*3/uL (ref 0.1–1.0)
Monocytes Relative: 9 %
Neutro Abs: 2 10*3/uL (ref 1.7–7.7)
Neutrophils Relative %: 48 %
Platelets: 260 10*3/uL (ref 150–400)
RBC: 4.77 MIL/uL (ref 3.87–5.11)
RDW: 12.7 % (ref 11.5–15.5)
Smear Review: NORMAL
WBC: 4.1 10*3/uL (ref 4.0–10.5)
nRBC: 0 % (ref 0.0–0.2)

## 2019-09-27 LAB — COMPREHENSIVE METABOLIC PANEL
ALT: 19 U/L (ref 0–44)
AST: 35 U/L (ref 15–41)
Albumin: 3 g/dL — ABNORMAL LOW (ref 3.5–5.0)
Alkaline Phosphatase: 59 U/L (ref 38–126)
Anion gap: 10 (ref 5–15)
BUN: 21 mg/dL (ref 8–23)
CO2: 23 mmol/L (ref 22–32)
Calcium: 8.7 mg/dL — ABNORMAL LOW (ref 8.9–10.3)
Chloride: 105 mmol/L (ref 98–111)
Creatinine, Ser: 0.73 mg/dL (ref 0.44–1.00)
GFR calc Af Amer: >60 mL/min (ref >60)
GFR calc non Af Amer: >60 mL/min (ref >60)
Glucose, Bld: 88 mg/dL (ref 70–99)
Potassium: 3.4 mmol/L — ABNORMAL LOW (ref 3.5–5.1)
Sodium: 138 mmol/L (ref 135–145)
Total Bilirubin: 0.4 mg/dL (ref 0.3–1.2)
Total Protein: 7.3 g/dL (ref 6.5–8.1)

## 2019-09-27 LAB — C-REACTIVE PROTEIN: CRP: 3.4 mg/dL — ABNORMAL HIGH (ref <1.0)

## 2019-09-27 LAB — FERRITIN: Ferritin: 381 ng/mL — ABNORMAL HIGH (ref 11–307)

## 2019-09-27 LAB — FIBRIN DERIVATIVES D-DIMER (ARMC ONLY): Fibrin derivatives D-dimer (ARMC): 1073.63 ng/mL (FEU) — ABNORMAL HIGH (ref 0.00–499.00)

## 2019-09-27 MED ORDER — ASCORBIC ACID 500 MG PO TABS: 500.0000 mg | ORAL_TABLET | Freq: Every day | ORAL | 0 refills | Status: DC

## 2019-09-27 MED ORDER — DM-GUAIFENESIN ER 30-600 MG PO TB12: 1.0000 | ORAL_TABLET | Freq: Two times a day (BID) | ORAL | 0 refills | Status: DC | PRN

## 2019-09-27 MED ORDER — ZINC SULFATE 220 (50 ZN) MG PO CAPS: 220.0000 mg | ORAL_CAPSULE | Freq: Every day | ORAL | 0 refills | Status: DC

## 2019-09-27 MED ORDER — AMOXICILLIN-POT CLAVULANATE 875-125 MG PO TABS
1.0000 | ORAL_TABLET | Freq: Two times a day (BID) | ORAL | Status: DC
  Administered 2019-09-27: 1 via ORAL
  Filled 2019-09-27 (×2): qty 1

## 2019-09-27 MED ORDER — POTASSIUM CHLORIDE 10 MEQ/100ML IV SOLN: 10.0000 meq | INTRAVENOUS | Status: DC

## 2019-09-27 MED ORDER — AMOXICILLIN-POT CLAVULANATE 875-125 MG PO TABS: 1.0000 | ORAL_TABLET | Freq: Two times a day (BID) | ORAL | 0 refills | Status: DC

## 2019-09-27 MED ORDER — POTASSIUM CHLORIDE CRYS ER 20 MEQ PO TBCR
20.0000 meq | EXTENDED_RELEASE_TABLET | Freq: Once | ORAL | Status: AC
  Administered 2019-09-27: 12:00:00 20 meq via ORAL
  Filled 2019-09-27: qty 1

## 2019-09-27 MED ORDER — AMOXICILLIN-POT CLAVULANATE 875-125 MG PO TABS: 1.0000 | ORAL_TABLET | Freq: Two times a day (BID) | ORAL | 0 refills | Status: AC

## 2019-09-27 NOTE — Progress Notes (Signed)
   09/27/19 1228  Family/Significant Other Communication  Family/Significant Other Update Called;Updated (bobby)

## 2019-09-27 NOTE — TOC Transition Note (Signed)
Transition of Care [facility]) - CM/SW Discharge Note   Patient Details  Name: Victoria Wilkerson MRN: [id number] Date of Birth: 1945-08-18  [facility] CM/SW Contact:  Boris Sharper, LCSW Phone Number: 09/27/2019, 11:14 AM   Clinical Narrative:    Pt medically stable for discharge per MD.  Pt will be transported home by her Daughter [name]. CSW notified Tillie Rung with [facility] of pt's discharge. [facility] to follow for PT, OT, NA and SW.  Final next level of care: Home w Home Health Services Barriers to Discharge: No Barriers Identified   Patient Goals and CMS Choice Patient states their goals for this hospitalization and ongoing recovery are:: daughter would like for the patient to return home with home health services CMS Medicare.gov Compare Post Acute Care list provided to:: Patient Represenative (must comment) Choice offered to / list presented to : Adult Children(daughter [name])  Discharge Placement                Patient to be transferred to facility by: [name] Name of family member notified: Bobbie Patient and family notified of of transfer: 09/27/19  Discharge Plan and Services   Discharge Planning Services: CM Consult Post Acute Care Choice: Home Health          DME Arranged: Gilford Rile rolling, 3-N-1 DME Agency: [employer] Date DME Agency Contacted: 09/25/19 Time DME Agency Contacted: [phone] Representative spoke with at DME Agency: Thedore Mins HH Arranged: PT, OT, Nurse's Aide, Social Work [employer] Agency: [facility] [place] Date [place]: 09/25/19 Time Reader: [phone] Representative spoke with at [place]: [place] ([place]) Interventions     Readmission Risk Interventions No flowsheet data found.

## 2019-09-27 NOTE — Discharge Summary (Addendum)
Victoria Wilkerson [id number] DOB: 06/22/1945 DOA: 09/23/2019  [facility]P: [facility] [place] date: 09/23/2019 Discharge date: 09/27/2019  Admitted From: Home Disposition: Home  Recommendations for Outpatient Follow-up:  1. Follow up with PCP in 1 week 2. Please obtain BMP/CBC in one week  Home Health: PT/OT   Discharge Condition:Stable CODE STATUS: Full Diet recommendation: Heart Healthy / Carb Modified / Regular / Dysphagia  Brief/Interim Summary: Victoria Wilkerson is a 74 y.o. female with medical history significant of hypertension, TIA, CKD-3, Dementia, who presents with cough, generalized weakness, fever, nausea, vomiting and abdominal pain.  She was found with Covid antigen test positive.  Also found with worsening renal function and temperature of 100.7 on admission.  Chest x-ray was negative.CT abdomen/pelvis showed multifocal pneumonia and 1.6 cm right adrenal adenoma  Pneumonia due to COVID-19 virus: CT scan showed multifocal infiltration.. Continue Remdesivir, ceftriaxone, azithromycin -vitamin C, zinc. -prnalbuterol inhaler -PRN Mucinex for cough -f/u Blood culture, pending.  Urine culture multiple species needs recollection -Gentle IV fluid -D-dimer, BNP,Trop, LFT, CRP, LDH, Procalcitonin, Ferritin, fibinogen, TG, Hep B SAg, HIV ab -Daily CRP, Ferritin, D-dimer,  maintain euvolemia to a net negative fluid status Wbc down mildly need to monitor   UTI (urinary tract infection): Was treated for possible UTI with +UA, but urine culture had too many organisms and needed recollection   Sepsisdue to  and pneumonia secondary to COVID-19 infection:Patient meets critical for sepsis with fever, tachycardia. elevated lactic acid Currently hemodynamically stable CRP decreasing Lactic acid improved with IV fluids Vitals now stable.  Was treated with IV remdesivir and Rocephin. Completed course of IV remdesivir. Augmentin as outpatient to complete  course  HTN:  --heldHCTZ and Cozaar due to worsening renal function Resume Losartan as outpt. D/ c Hctz as it also caused hypokalemia. 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  Improved with iv hydration likely due to prerenal/sepsis and continuation of Cozaar and HCTZ Resumed losartan as outpatient   Dementia- at baseline when I spoke to her daughter. Pt has good and bad days. At times not oriented at home.   Hypokalemia- replaced today. Will need f/u labs as outpt   Discharge Diagnoses:  Principal Problem:   Pneumonia due to COVID-19 virus Active Problems:   Hypertension   Sepsis ([place])   UTI (urinary tract infection)   Adrenal adenoma-right   Acute renal failure superimposed on stage 3a chronic kidney disease [facility])    Discharge Instructions  Discharge Instructions    Call MD for:  difficulty breathing, headache or visual disturbances   Complete by: As directed    Call MD for:  temperature >100.4   Complete by: As directed    Diet - low sodium heart healthy   Complete by: As directed    Increase activity slowly   Complete by: As directed      Allergies as of 09/27/2019   No Known Allergies     Medication List    STOP taking these medications   hydrochlorothiazide 12.5 MG tablet Commonly known as: HYDRODIURIL     TAKE these medications   amoxicillin-clavulanate 875-125 MG tablet Commonly known as: AUGMENTIN Take 1 tablet by mouth every 12 (twelve) hours for 4 days.   ascorbic acid 500 MG tablet Commonly known as: VITAMIN C Take 1 tablet (500 mg total) by mouth  daily.   dextromethorphan-guaiFENesin 30-600 MG 12hr tablet Commonly known as: MUCINEX DM Take 1 tablet by mouth 2 (two) times daily as needed for cough.   losartan 25 MG tablet Commonly known as:  COZAAR Take 25 mg by mouth daily.   zinc sulfate 220 (50 Zn) MG capsule Take 1 capsule (220 mg total) by mouth daily.            Durable Medical Equipment  (From admission, onward)         Start     Ordered   09/25/19 1630  For home use only DME 3 n 1  Once     09/25/19 1629   09/25/19 1626  For home use only DME Walker rolling  Once    Question Answer Comment  Walker: With 5 Inch Wheels   Patient needs a walker to treat with the following condition COVID-19      09/25/19 1629         Follow-up [place], [place]. Schedule an appointment as soon as possible for a visit in 1 week.   Specialty: General Practice Contact information: [place] [address] [phone]          No Known Allergies  Consultations: none Procedures/Studies: CT ABDOMEN PELVIS W CONTRAST  Result Date: 09/23/2019 CLINICAL DATA:  Nausea, vomiting. EXAM: CT ABDOMEN AND PELVIS WITH CONTRAST TECHNIQUE: Multidetector CT imaging of the abdomen and pelvis was performed using the standard protocol following bolus administration of intravenous contrast. CONTRAST:  31mL OMNIPAQUE IOHEXOL 300 MG/ML  SOLN COMPARISON:  None. FINDINGS: Lower chest: Multifocal airspace opacities are noted in the visualized lung bases concerning for multifocal pneumonia, potentially of viral etiology. Hepatobiliary: No focal liver abnormality is seen. Status post cholecystectomy. No biliary dilatation. Pancreas: Unremarkable. No pancreatic ductal dilatation or surrounding inflammatory changes. Spleen: Normal in size without focal abnormality. Adrenals/Urinary Tract: 1.6 cm right adrenal adenoma is noted. Left adrenal gland appears normal. No hydronephrosis or renal obstruction is noted. No renal or ureteral calculi are noted. Urinary bladder is unremarkable. Stomach/Bowel: Stomach is within normal limits. Appendix appears normal. No evidence of bowel wall thickening,  distention, or inflammatory changes. Vascular/Lymphatic: No significant vascular findings are present. No enlarged abdominal or pelvic lymph nodes. Reproductive: Uterus and bilateral adnexa are unremarkable. Other: No abdominal wall hernia or abnormality. No abdominopelvic ascites. Musculoskeletal: No acute or significant osseous findings. IMPRESSION: 1. Multiple airspace opacities are noted in the visualized lung bases concerning for multifocal pneumonia, potentially of viral etiology. 2. 1.6 cm right adrenal adenoma. 3. No other abnormality seen in the abdomen or pelvis. Electronically Signed   By: Marijo Conception M.D.   On: 09/23/2019 10:47   DG Chest Portable 1 View  Result Date: 09/23/2019 CLINICAL DATA:  Fever and weakness. Patient c/o N/V/D and abdominal pain since yesterday.Hx of TIA, htn, non smoker EXAM: PORTABLE CHEST 1 VIEW COMPARISON:  None. FINDINGS: The heart size and mediastinal contours are within normal limits. Both lungs are clear. No pleural effusion or pneumothorax. The visualized skeletal structures are grossly intact. IMPRESSION: No active disease. Electronically Signed   By: Lajean Manes M.D.   On: 09/23/2019 10:07      Subjective: No complaints by patient.  She denies shortness of breath, chest pain, diarrhea  Discharge Exam: Vitals:   09/27/19 0838 09/27/19 1127  BP: (!) 148/73 (!) 149/70  Pulse: 83   Resp: 18   Temp: 98.6 F (37  C)   SpO2: 97%    Vitals:   09/26/19 1300 09/26/19 1600 09/27/19 0838 09/27/19 1127  BP: 134/74 139/80 (!) 148/73 (!) 149/70  Pulse: 80 79 83   Resp: 13 18 18    Temp: 98.6 F (37 C)  98.6 F (37 C)   TempSrc: Oral  Oral   SpO2: 99%  97%   Weight:      Height:        General: Pt is alert, awake, not in acute distress Cardiovascular: RRR, S1/S2 +, no rubs, no gallops Respiratory: CTA bilaterally, no wheezing, no rhonchi Abdominal: Soft, NT, ND, bowel sounds + Extremities: no edema, no cyanosis    The results of significant  diagnostics from this hospitalization (including imaging, microbiology, ancillary and laboratory) are listed below for reference.     Microbiology: Recent Results (from the past 240 hour(s))  Urine Culture     Status: Abnormal   Collection Time: 09/23/19 11:42 AM   Specimen: Urine, Random  Result Value Ref Range Status   Specimen Description   Final    URINE, RANDOM Performed at [facility], [address] [place], [place] [address]    Special Requests   Final    NONE Performed at [facility], [address] [place], [place] [address]    Culture MULTIPLE SPECIES PRESENT, SUGGEST RECOLLECTION (A)  Final   Report Status 09/24/2019 FINAL  Final  SARS Coronavirus 2 by RT PCR (hospital order, performed in [place] hospital lab) Nasopharyngeal Nasopharyngeal Swab     Status: Abnormal   Collection Time: 09/23/19 12:42 PM   Specimen: Nasopharyngeal Swab  Result Value Ref Range Status   SARS Coronavirus 2 POSITIVE (A) NEGATIVE Final    Comment: RESULT CALLED TO, READ BACK BY AND VERIFIED WITH: DEE MCCLAIN RN AT [id number] ON 09/23/19 SNG (NOTE) SARS-CoV-2 target nucleic acids are DETECTED SARS-CoV-2 RNA is generally detectable in upper respiratory specimens  during the acute phase of infection.  Positive results are indicative  of the presence of the identified virus, but do not rule out bacterial infection or co-infection with other pathogens not detected by the test.  Clinical correlation with patient history and  other diagnostic information is necessary to determine patient infection status.  The expected result is negative. Fact Sheet for Patients:   StrictlyIdeas.no  Fact Sheet for Healthcare Providers:   BankingDealers.co.za   This test is not yet approved or cleared by the [place] FDA and  has been authorized for detection and/or diagnosis of SARS-CoV-2 by FDA under an Emergency Use Authorization (EUA).   This EUA will remain in effect (meaning this test ca n be used) for the duration of  the COVID-19 declaration under Section 564(b)(1) of the Act, 21 U.S.C. section 360-bbb-3(b)(1), unless the authorization is terminated or revoked sooner. Performed at [facility], [address] [place], [place] [address]   Blood culture (routine x 2)     Status: None (Preliminary result)   Collection Time: 09/23/19  1:17 PM   Specimen: BLOOD  Result Value Ref Range Status   Specimen Description BLOOD RIGHT [facility]  Final   Special Requests   Final    BOTTLES DRAWN AEROBIC AND ANAEROBIC Blood Culture results may not be optimal due to an inadequate volume of blood received in culture bottles   Culture   Final    NO GROWTH 4 DAYS Performed at [facility], [address], [place], [place] [address]    Report  Status PENDING  Incomplete  Blood culture (routine x 2)     Status: None (Preliminary result)   Collection Time: 09/23/19  1:17 PM   Specimen: BLOOD  Result Value Ref Range Status   Specimen Description BLOOD RIGHT FA  Final   Special Requests   Final    BOTTLES DRAWN AEROBIC AND ANAEROBIC Blood Culture results may not be optimal due to an inadequate volume of blood received in culture bottles   Culture   Final    NO GROWTH 4 DAYS Performed at [facility], [address] [place],  [address]    Report Status PENDING  Incomplete     Labs: BNP (last 3 results) Recent Labs    09/23/19 2028  BNP [id number]   Basic Metabolic Panel: Recent Labs  Lab 09/23/19 0921 09/23/19 2300 09/24/19 0500 09/25/19 0536 09/26/19 0542 09/27/19 0652  NA 137  --  139 142 141 138  K 3.5  --  3.5 3.4* 3.7 3.4*  CL 102  --  107 113* 112* 105  CO2 25  --  21* 23 23 23   GLUCOSE 107*  --  89 77 77 88  BUN 47*  --  42* 34* 22 21  CREATININE 1.79*  --  1.39* 0.96 0.77 0.73  CALCIUM 8.6*  --  8.4* 8.6* 8.4* 8.7*  MG  --  2.1  --   --   --   --    Liver Function Tests: Recent  Labs  Lab 09/23/19 0921 09/24/19 0500 09/25/19 0536 09/26/19 0542 09/27/19 0652  AST 40 41 38 40 35  ALT 24 21 18 19 19   ALKPHOS 66 60 57 57 59  BILITOT 0.8 0.6 0.4 0.6 0.4  PROT 8.7* 8.1 7.8 7.2 7.3  ALBUMIN 3.9 3.5 3.3* 3.0* 3.0*   Recent Labs  Lab 09/23/19 0921  LIPASE 48   No results for input(s): AMMONIA in the last 168 hours. CBC: Recent Labs  Lab 09/23/19 0921 09/24/19 0500 09/25/19 0536 09/26/19 0542 09/27/19 0652  WBC 4.1 4.5 3.4* 3.2* 4.1  NEUTROABS  --  3.0 1.5* 1.2* 2.0  HGB 13.5 13.3 12.7 13.6 13.5  HCT 42.0 41.1 39.3 40.7 40.8  MCV 88.8 89.2 88.7 87.0 85.5  PLT 220 232 229 252 260   Cardiac Enzymes: No results for input(s): CKTOTAL, CKMB, CKMBINDEX, TROPONINI in the last 168 hours. BNP: Invalid input(s): POCBNP CBG: No results for input(s): GLUCAP in the last 168 hours. D-Dimer No results for input(s): DDIMER in the last 72 hours. Hgb A1c No results for input(s): HGBA1C in the last 72 hours. Lipid Profile No results for input(s): CHOL, HDL, LDLCALC, TRIG, CHOLHDL, LDLDIRECT in the last 72 hours. Thyroid function studies No results for input(s): TSH, T4TOTAL, T3FREE, THYROIDAB in the last 72 hours.  Invalid input(s): FREET3 Anemia work up Recent Labs    09/26/19 0542 09/27/19 0652  FERRITIN 380* 381*   Urinalysis    Component Value Date/Time   COLORURINE YELLOW (A) 09/23/2019 1142   APPEARANCEUR CLOUDY (A) 09/23/2019 1142   LABSPEC 1.042 (H) 09/23/2019 1142   PHURINE 5.0 09/23/2019 1142   GLUCOSEU NEGATIVE 09/23/2019 1142   HGBUR SMALL (A) 09/23/2019 1142   BILIRUBINUR NEGATIVE 09/23/2019 1142   [place] 09/23/2019 1142   PROTEINUR 30 (A) 09/23/2019 1142   NITRITE NEGATIVE 09/23/2019 1142   LEUKOCYTESUR LARGE (A) 09/23/2019 1142   Sepsis Labs Invalid input(s): PROCALCITONIN,  WBC,  LACTICIDVEN Microbiology Recent Results (from the past 240  hour(s))  Urine Culture     Status: Abnormal   Collection Time: 09/23/19 11:42  AM   Specimen: Urine, Random  Result Value Ref Range Status   Specimen Description   Final    URINE, RANDOM Performed at [facility], [address] [place], [place] [address]    Special Requests   Final    NONE Performed at [facility], [address] [place], [place] [address]    Culture MULTIPLE SPECIES PRESENT, SUGGEST RECOLLECTION (A)  Final   Report Status 09/24/2019 FINAL  Final  SARS Coronavirus 2 by RT PCR (hospital order, performed in [place] hospital lab) Nasopharyngeal Nasopharyngeal Swab     Status: Abnormal   Collection Time: 09/23/19 12:42 PM   Specimen: Nasopharyngeal Swab  Result Value Ref Range Status   SARS Coronavirus 2 POSITIVE (A) NEGATIVE Final    Comment: RESULT CALLED TO, READ BACK BY AND VERIFIED WITH: DEE MCCLAIN RN AT [id number] ON 09/23/19 SNG (NOTE) SARS-CoV-2 target nucleic acids are DETECTED SARS-CoV-2 RNA is generally detectable in upper respiratory specimens  during the acute phase of infection.  Positive results are indicative  of the presence of the identified virus, but do not rule out bacterial infection or co-infection with other pathogens not detected by the test.  Clinical correlation with patient history and  other diagnostic information is necessary to determine patient infection status.  The expected result is negative. Fact Sheet for Patients:   StrictlyIdeas.no  Fact Sheet for Healthcare Providers:   BankingDealers.co.za   This test is not yet approved or cleared by the [place] FDA and  has been authorized for detection and/or diagnosis of SARS-CoV-2 by FDA under an Emergency Use Authorization (EUA).  This EUA will remain in effect (meaning this test ca n be used) for the duration of  the COVID-19 declaration under Section 564(b)(1) of the Act, 21 U.S.C. section 360-bbb-3(b)(1), unless the authorization is terminated or revoked sooner. Performed at [facility], [address] [place], [place] [address]   Blood culture (routine x 2)     Status: None (Preliminary result)   Collection Time: 09/23/19  1:17 PM   Specimen: BLOOD  Result Value Ref Range Status   Specimen Description BLOOD RIGHT [facility]  Final   Special Requests   Final    BOTTLES DRAWN AEROBIC AND ANAEROBIC Blood Culture results may not be optimal due to an inadequate volume of blood received in culture bottles   Culture   Final    NO GROWTH 4 DAYS Performed at [facility], [address], [place], [place] [address]    Report Status PENDING  Incomplete  Blood culture (routine x 2)     Status: None (Preliminary result)   Collection Time: 09/23/19  1:17 PM   Specimen: BLOOD  Result Value Ref Range Status   Specimen Description BLOOD RIGHT FA  Final   Special Requests   Final    BOTTLES DRAWN AEROBIC AND ANAEROBIC Blood Culture results may not be optimal due to an inadequate volume of blood received in culture bottles   Culture   Final    NO GROWTH 4 DAYS Performed at [facility], [address] [place], [place] [address]    Report Status PENDING  Incomplete     Time coordinating discharge: Over 30 minutes  SIGNED:   Nolberto Hanlon, MD  [facility] 09/27/2019, 11:28 AM Pager   If 7PM-7AM, please contact night-coverage [email or web address] Password [email or web address]

## 2019-09-27 NOTE — Progress Notes (Signed)
   09/27/19 1100  Family/Significant Other Communication  Family/Significant Other Update Called (sister bobby no answer left VM )

## 2019-09-28 LAB — CULTURE, BLOOD (ROUTINE X 2)
Culture: NO GROWTH
Culture: NO GROWTH

## 2021-03-05 ENCOUNTER — Emergency Department
Admission: EM | Admit: 2021-03-05 | Discharge: 2021-03-05 | Discharge status: Against Medical Advice | Payer: Medicare Other

## 2021-03-05 NOTE — ED Triage Notes (Signed)
Pt called for triage, no response. 

## 2021-03-05 NOTE — ED Notes (Signed)
Pt called x's 3, no response ?

## 2021-04-16 IMAGING — DX DG CHEST 1V PORT
1 series · 1 of 1 positions shown · non-contrast
Comparison: None.

CLINICAL DATA: Fever and weakness. Patient c/o N/V/D and abdominal
pain since yesterday.Hx of TIA, htn, non smoker

EXAM:
PORTABLE CHEST 1 VIEW

[chest ap]
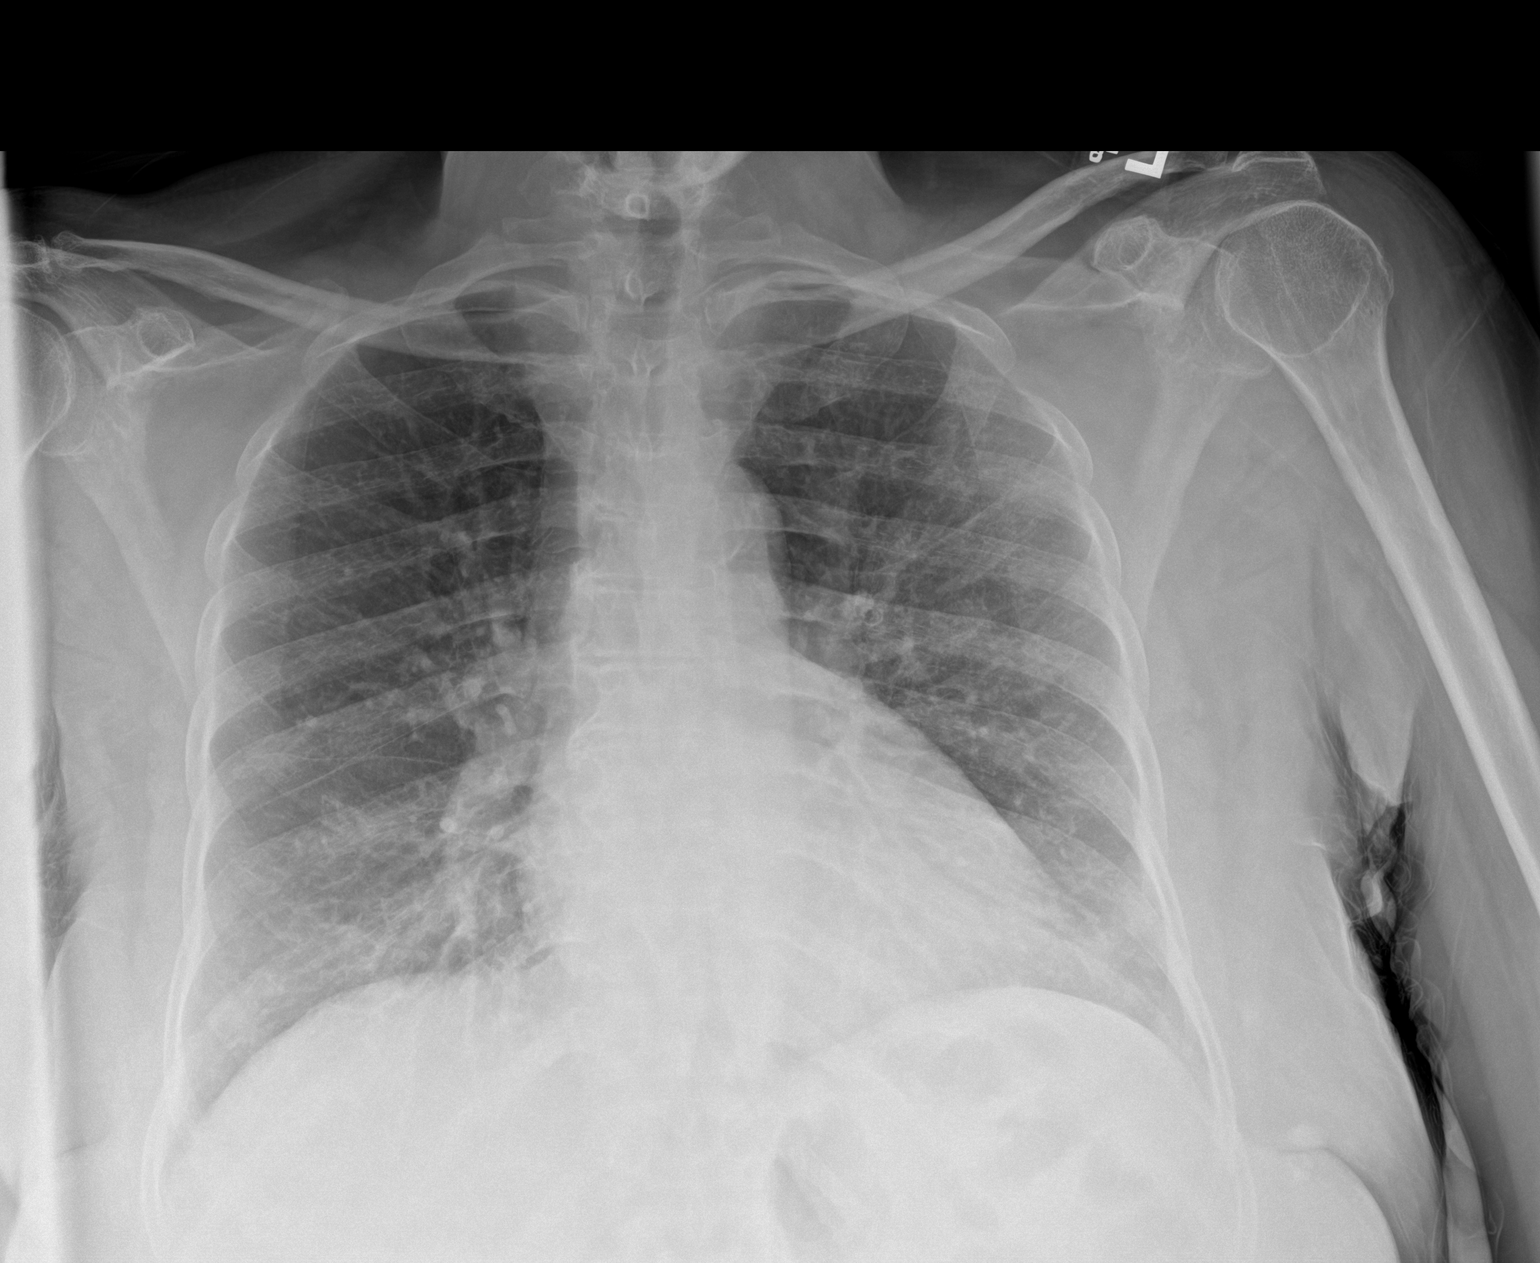

[1 of 1 positions shown; findings below may reference images not displayed]

FINDINGS: The heart size and mediastinal contours are within normal limits.
Both lungs are clear. No pleural effusion or pneumothorax. The
visualized skeletal structures are grossly intact.
IMPRESSION: No active disease.

## 2021-09-13 ENCOUNTER — Other Ambulatory Visit: Payer: Self-pay

## 2021-09-13 ENCOUNTER — Emergency Department: Payer: Medicare Other

## 2021-09-13 ENCOUNTER — Inpatient Hospital Stay
Admission: EM | Admit: 2021-09-13 | Discharge: 2021-09-16 | DRG: 689 | Disposition: A | Discharge status: Skilled Nursing Facility | Payer: Medicare Other | Attending: Internal Medicine | Admitting: Internal Medicine

## 2021-09-13 DIAGNOSIS — F03A18 Unspecified dementia, mild, with other behavioral disturbance: Secondary | ICD-10-CM | POA: Diagnosis present

## 2021-09-13 DIAGNOSIS — Z515 Encounter for palliative care: Secondary | ICD-10-CM

## 2021-09-13 DIAGNOSIS — N3000 Acute cystitis without hematuria: Secondary | ICD-10-CM

## 2021-09-13 DIAGNOSIS — R4586 Emotional lability: Secondary | ICD-10-CM | POA: Diagnosis present

## 2021-09-13 DIAGNOSIS — E039 Hypothyroidism, unspecified: Secondary | ICD-10-CM | POA: Diagnosis present

## 2021-09-13 DIAGNOSIS — I10 Essential (primary) hypertension: Secondary | ICD-10-CM

## 2021-09-13 DIAGNOSIS — R4182 Altered mental status, unspecified: Secondary | ICD-10-CM | POA: Diagnosis not present

## 2021-09-13 DIAGNOSIS — F03A2 Unspecified dementia, mild, with psychotic disturbance: Secondary | ICD-10-CM | POA: Diagnosis present

## 2021-09-13 DIAGNOSIS — Z7982 Long term (current) use of aspirin: Secondary | ICD-10-CM

## 2021-09-13 DIAGNOSIS — R159 Full incontinence of feces: Secondary | ICD-10-CM | POA: Diagnosis present

## 2021-09-13 DIAGNOSIS — N390 Urinary tract infection, site not specified: Principal | ICD-10-CM | POA: Diagnosis present

## 2021-09-13 DIAGNOSIS — Z8616 Personal history of COVID-19: Secondary | ICD-10-CM

## 2021-09-13 DIAGNOSIS — N1831 Chronic kidney disease, stage 3a: Secondary | ICD-10-CM

## 2021-09-13 DIAGNOSIS — I129 Hypertensive chronic kidney disease with stage 1 through stage 4 chronic kidney disease, or unspecified chronic kidney disease: Secondary | ICD-10-CM | POA: Diagnosis present

## 2021-09-13 DIAGNOSIS — F03A0 Unspecified dementia, mild, without behavioral disturbance, psychotic disturbance, mood disturbance, and anxiety: Secondary | ICD-10-CM | POA: Diagnosis not present

## 2021-09-13 DIAGNOSIS — Z1611 Resistance to penicillins: Secondary | ICD-10-CM | POA: Diagnosis present

## 2021-09-13 DIAGNOSIS — Z79890 Hormone replacement therapy (postmenopausal): Secondary | ICD-10-CM

## 2021-09-13 DIAGNOSIS — Z79899 Other long term (current) drug therapy: Secondary | ICD-10-CM

## 2021-09-13 DIAGNOSIS — N183 Chronic kidney disease, stage 3 unspecified: Secondary | ICD-10-CM | POA: Insufficient documentation

## 2021-09-13 DIAGNOSIS — R269 Unspecified abnormalities of gait and mobility: Secondary | ICD-10-CM | POA: Diagnosis present

## 2021-09-13 DIAGNOSIS — Z66 Do not resuscitate: Secondary | ICD-10-CM | POA: Diagnosis present

## 2021-09-13 DIAGNOSIS — F03918 Unspecified dementia, unspecified severity, with other behavioral disturbance: Secondary | ICD-10-CM | POA: Diagnosis present

## 2021-09-13 DIAGNOSIS — G9341 Metabolic encephalopathy: Secondary | ICD-10-CM | POA: Diagnosis present

## 2021-09-13 DIAGNOSIS — R5381 Other malaise: Secondary | ICD-10-CM | POA: Diagnosis present

## 2021-09-13 DIAGNOSIS — N179 Acute kidney failure, unspecified: Secondary | ICD-10-CM | POA: Diagnosis present

## 2021-09-13 DIAGNOSIS — R32 Unspecified urinary incontinence: Secondary | ICD-10-CM | POA: Diagnosis present

## 2021-09-13 DIAGNOSIS — Z8673 Personal history of transient ischemic attack (TIA), and cerebral infarction without residual deficits: Secondary | ICD-10-CM

## 2021-09-13 LAB — COMPREHENSIVE METABOLIC PANEL
ALT: 19 U/L (ref 0–44)
AST: 34 U/L (ref 15–41)
Albumin: 3.6 g/dL (ref 3.5–5.0)
Alkaline Phosphatase: 61 U/L (ref 38–126)
Anion gap: 12 (ref 5–15)
BUN: 25 mg/dL — ABNORMAL HIGH (ref 8–23)
CO2: 25 mmol/L (ref 22–32)
Calcium: 9.7 mg/dL (ref 8.9–10.3)
Chloride: 97 mmol/L — ABNORMAL LOW (ref 98–111)
Creatinine, Ser: 1.48 mg/dL — ABNORMAL HIGH (ref 0.44–1.00)
GFR, Estimated: 37 mL/min — ABNORMAL LOW (ref >60)
Glucose, Bld: 115 mg/dL — ABNORMAL HIGH (ref 70–99)
Potassium: 3.6 mmol/L (ref 3.5–5.1)
Sodium: 134 mmol/L — ABNORMAL LOW (ref 135–145)
Total Bilirubin: 0.6 mg/dL (ref 0.3–1.2)
Total Protein: 7.9 g/dL (ref 6.5–8.1)

## 2021-09-13 LAB — CBC WITH DIFFERENTIAL/PLATELET
Abs Immature Granulocytes: 0.04 10*3/uL (ref 0.00–0.07)
Basophils Absolute: 0 10*3/uL (ref 0.0–0.1)
Basophils Relative: 1 %
Eosinophils Absolute: 0 10*3/uL (ref 0.0–0.5)
Eosinophils Relative: 0 %
HCT: 41.2 % (ref 36.0–46.0)
Hemoglobin: 13.1 g/dL (ref 12.0–15.0)
Immature Granulocytes: 1 %
Lymphocytes Relative: 6 %
Lymphs Abs: 0.3 10*3/uL — ABNORMAL LOW (ref 0.7–4.0)
MCH: 29.4 pg (ref 26.0–34.0)
MCHC: 31.8 g/dL (ref 30.0–36.0)
MCV: 92.4 fL (ref 80.0–100.0)
Monocytes Absolute: 0.5 10*3/uL (ref 0.1–1.0)
Monocytes Relative: 10 %
Neutro Abs: 4.3 10*3/uL (ref 1.7–7.7)
Neutrophils Relative %: 82 %
Platelets: 324 10*3/uL (ref 150–400)
RBC: 4.46 MIL/uL (ref 3.87–5.11)
RDW: 13.2 % (ref 11.5–15.5)
WBC: 5.2 10*3/uL (ref 4.0–10.5)
nRBC: 0 % (ref 0.0–0.2)

## 2021-09-13 LAB — ETHANOL: Alcohol, Ethyl (B): <10 mg/dL (ref <10)

## 2021-09-13 LAB — TROPONIN I (HIGH SENSITIVITY)
Troponin I (High Sensitivity): 51 ng/L — ABNORMAL HIGH (ref <18)
Troponin I (High Sensitivity): 42 ng/L — ABNORMAL HIGH (ref <18)

## 2021-09-13 LAB — URINALYSIS, ROUTINE W REFLEX MICROSCOPIC
Bilirubin Urine: NEGATIVE
Glucose, UA: NEGATIVE mg/dL
Ketones, ur: NEGATIVE mg/dL
Nitrite: NEGATIVE
Protein, ur: NEGATIVE mg/dL
Specific Gravity, Urine: 1.016 (ref 1.005–1.030)
WBC, UA: >50 WBC/hpf — ABNORMAL HIGH (ref 0–5)
pH: 5 (ref 5.0–8.0)

## 2021-09-13 LAB — URINE DRUG SCREEN, QUALITATIVE (ARMC ONLY)
Amphetamines, Ur Screen: NOT DETECTED
Barbiturates, Ur Screen: NOT DETECTED
Benzodiazepine, Ur Scrn: NOT DETECTED
Cannabinoid 50 Ng, Ur ~~LOC~~: NOT DETECTED
Cocaine Metabolite,Ur ~~LOC~~: NOT DETECTED
MDMA (Ecstasy)Ur Screen: NOT DETECTED
Methadone Scn, Ur: NOT DETECTED
Opiate, Ur Screen: NOT DETECTED
Phencyclidine (PCP) Ur S: NOT DETECTED
Tricyclic, Ur Screen: NOT DETECTED

## 2021-09-13 LAB — ACETAMINOPHEN LEVEL: Acetaminophen (Tylenol), Serum: <10 ug/mL — ABNORMAL LOW (ref 10–30)

## 2021-09-13 LAB — LACTIC ACID, PLASMA
Lactic Acid, Venous: 1.5 mmol/L (ref 0.5–1.9)
Lactic Acid, Venous: 1.3 mmol/L (ref 0.5–1.9)

## 2021-09-13 LAB — TSH: TSH: 2.299 u[IU]/mL (ref 0.350–4.500)

## 2021-09-13 LAB — VALPROIC ACID LEVEL: Valproic Acid Lvl: 57 ug/mL (ref 50.0–100.0)

## 2021-09-13 LAB — SALICYLATE LEVEL: Salicylate Lvl: <7 mg/dL — ABNORMAL LOW (ref 7.0–30.0)

## 2021-09-13 LAB — LIPASE, BLOOD: Lipase: 29 U/L (ref 11–51)

## 2021-09-13 LAB — BRAIN NATRIURETIC PEPTIDE: B Natriuretic Peptide: 24.1 pg/mL (ref 0.0–100.0)

## 2021-09-13 MED ORDER — CEFTRIAXONE SODIUM 2 G IJ SOLR
2.0000 g | Freq: Once | INTRAMUSCULAR | Status: AC
  Administered 2021-09-13: 2 g via INTRAVENOUS
  Filled 2021-09-13: qty 20

## 2021-09-13 MED ORDER — ASPIRIN 81 MG PO CHEW
81.0000 mg | CHEWABLE_TABLET | Freq: Every day | ORAL | Status: DC
  Administered 2021-09-14 – 2021-09-16 (×3): 81 mg via ORAL
  Filled 2021-09-13 (×3): qty 1

## 2021-09-13 MED ORDER — LACTATED RINGERS IV BOLUS
1000.0000 mL | Freq: Once | INTRAVENOUS | Status: AC
Start: 2021-09-13 — End: 2021-09-13
  Administered 2021-09-13: 1000 mL via INTRAVENOUS

## 2021-09-13 MED ORDER — SODIUM CHLORIDE 0.9 % IV SOLN: INTRAVENOUS | Status: DC

## 2021-09-13 MED ORDER — DIVALPROEX SODIUM 250 MG PO DR TAB
250.0000 mg | DELAYED_RELEASE_TABLET | Freq: Two times a day (BID) | ORAL | Status: DC
  Filled 2021-09-13 (×2): qty 1

## 2021-09-13 MED ORDER — HEPARIN SODIUM (PORCINE) 5000 UNIT/ML IJ SOLN
5000.0000 [IU] | Freq: Three times a day (TID) | INTRAMUSCULAR | Status: DC
  Administered 2021-09-14 – 2021-09-16 (×5): 5000 [IU] via SUBCUTANEOUS
  Filled 2021-09-13 (×7): qty 1

## 2021-09-13 MED ORDER — RISPERIDONE 0.5 MG PO TABS
0.5000 mg | ORAL_TABLET | Freq: Every day | ORAL | Status: DC
  Administered 2021-09-14 – 2021-09-15 (×2): 0.5 mg via ORAL
  Filled 2021-09-13 (×3): qty 1

## 2021-09-13 MED ORDER — DEXTROSE-NACL 5-0.9 % IV SOLN: INTRAVENOUS | Status: AC

## 2021-09-13 MED ORDER — LEVOTHYROXINE SODIUM 50 MCG PO TABS
50.0000 ug | ORAL_TABLET | Freq: Every day | ORAL | Status: DC
  Administered 2021-09-14 – 2021-09-16 (×3): 50 ug via ORAL
  Filled 2021-09-13 (×3): qty 1

## 2021-09-13 MED ORDER — ONDANSETRON HCL 4 MG/2ML IJ SOLN: 4.0000 mg | Freq: Four times a day (QID) | INTRAMUSCULAR | Status: DC | PRN

## 2021-09-13 MED ORDER — ACETAMINOPHEN 160 MG/5ML PO SUSP
480.0000 mg | Freq: Three times a day (TID) | ORAL | Status: DC | PRN
  Administered 2021-09-15 – 2021-09-16 (×3): 480 mg via ORAL
  Filled 2021-09-13 (×5): qty 15

## 2021-09-13 MED ORDER — ONDANSETRON HCL 4 MG PO TABS
4.0000 mg | ORAL_TABLET | Freq: Four times a day (QID) | ORAL | Status: DC | PRN
  Administered 2021-09-16: 4 mg via ORAL
  Filled 2021-09-13: qty 1

## 2021-09-13 MED ORDER — SODIUM CHLORIDE 0.9 % IV SOLN
1.0000 g | INTRAVENOUS | Status: DC
  Administered 2021-09-14: 1 g via INTRAVENOUS
  Filled 2021-09-13 (×2): qty 10

## 2021-09-13 NOTE — ED Provider Notes (Signed)
? ?[facility] ?Provider Note ? ? ? None  ?  (approximate) ? ? ?History  ? ?Altered Mental Status ? ? ?HPI ? ?Victoria Wilkerson is a 76 y.o. female with a history of dementia who is having hallucinations and agitation during her adult daycare.  Note from 2 months ago said she was having crying spells and agitation was unable to get dressed on her own and has difficulty or had difficulty at the time recognizing people like her daughter.  She was put on Depakote and Aricept and Risperdal low-dose.  She has been getting worse now.  She is also having loose bowels and urinating on herself the morning.  Patient herself is confused and trying to get out of bed but she says she is not having any pain she is not having any nausea or back pain or trouble urinating burning when she urinates. ? ?  ? ? ?Physical Exam  ? ?Triage Vital Signs: ?ED Triage Vitals  ?Enc Vitals Group  ?   BP 09/13/21 1340 112/60  ?   Pulse Rate 09/13/21 1340 (!) 104  ?   Resp 09/13/21 1340 18  ?   Temp 09/13/21 1340 98.9 ?F (37.2 ?C)  ?   Temp Source 09/13/21 1340 Oral  ?   SpO2 09/13/21 1340 99 %  ?   Weight --   ?   Height --   ?   Head Circumference --   ?   Peak Flow --   ?   Pain Score 09/13/21 1334 0  ?   Pain Loc --   ?   Pain Edu? --   ?   Excl. in [place]? --   ? ? ?Most recent vital signs: ?Vitals:  ? 09/13/21 1340 09/13/21 1400  ?BP: 112/60 (!) 125/54  ?Pulse: (!) 104 95  ?Resp: 18 13  ?Temp: 98.9 ?F (37.2 ?C)   ?SpO2: 99% 97%  ? ? ? ?General: Awake, confused although this seems like her baseline.  She is moving all extremities equally and well. ?CV:  Good peripheral perfusion.  Heart regular rate and rhythm no audible murmurs ?Resp:  Normal effort.  Lungs are clear ?Abd:  No distention.  Abdomen soft bowel sounds are positive there is no organomegaly there is no CVA tenderness ?Extremities with no edema ? ? ?ED Results / Procedures / Treatments  ? ?Labs ?(all labs ordered are listed, but only abnormal results are  displayed) ?Labs Reviewed  ?ACETAMINOPHEN LEVEL - Abnormal; Notable for the following components:  ?    Result Value  ? Acetaminophen (Tylenol), Serum <10 (*)   ? All other components within normal limits  ?SALICYLATE LEVEL - Abnormal; Notable for the following components:  ? Salicylate Lvl <4.4 (*)   ? All other components within normal limits  ?CBC WITH DIFFERENTIAL/PLATELET - Abnormal; Notable for the following components:  ? Lymphs Abs 0.3 (*)   ? All other components within normal limits  ?BLOOD GAS, VENOUS - Abnormal; Notable for the following components:  ? Bicarbonate 32.7 (*)   ? Acid-Base Excess 6.2 (*)   ? All other components within normal limits  ?ETHANOL  ?LACTIC ACID, PLASMA  ?VALPROIC ACID LEVEL  ?BRAIN NATRIURETIC PEPTIDE  ?COMPREHENSIVE METABOLIC PANEL  ?LACTIC ACID, PLASMA  ?LIPASE, BLOOD  ?URINALYSIS, ROUTINE W REFLEX MICROSCOPIC  ?URINE DRUG SCREEN, QUALITATIVE ([facility] ONLY)  ?TSH  ?TROPONIN I (HIGH SENSITIVITY)  ? ? ? ?EKG ? ?EKG read interpreted by me shows normal sinus rhythm at  91 normal axis no acute ST-T wave changes ? ? ?RADIOLOGY ?CT read by radiology films interpreted by me ?Chest x-ray reviewed by me and interpreted by me possibly a small infiltrate in the right lower lobe.  See what the radiologist says. ? ? ?PROCEDURES: ? ?Critical Care performed:  ? ?Procedures ? ? ?MEDICATIONS ORDERED IN ED: ?Medications - No data to display ? ? ?IMPRESSION / MDM / ASSESSMENT AND PLAN / ED COURSE  ?I reviewed the triage vital signs and the nursing notes. ?Patient with worsening mental status that apparently came on quickly.  Work-up is still in progress. ?I will sign this patient out to the oncoming provider. ? ?Differential diagnosis includes, but is not limited to, patient could have just gotten worse or she could have an infection or stroke or something similar making it worse.  Could be some metabolic process going on as well. ? ? ?  ? ? ?FINAL CLINICAL IMPRESSION(S) / ED DIAGNOSES  ? ?Final  diagnoses:  ?Altered mental status, unspecified altered mental status type  ? ? ? ?Rx / DC Orders  ? ?ED Discharge Orders   ? ? None  ? ?  ? ? ? ?Note:  This document was prepared using Dragon voice recognition software and may include unintentional dictation errors. ?  ?Nena Polio, MD ?09/13/21 1512 ? ?

## 2021-09-13 NOTE — Hospital Course (Addendum)
76 year old with past medical history of dementia, stage III chronic kidney disease and hypertension from home who goes to daily adult daycare presented to the emergency room on 5/16 with increased confusion felt to be secondary to urinary tract infection.  Treated with IV fluids and antibiotics.  Seen by PT who recommended skilled nursing. ?

## 2021-09-13 NOTE — H&P (Signed)
?[facility] ?History and Physical ? ?Victoria Wilkerson VEL:[id number] DOB: [birth year] DOA: 09/13/2021 ? ?Referring physician: ED ? ?PCP: [facility] [place]  ? ?Patient is coming from: Home ? ?Chief Complaint: Altered mental status ? ?History is limited due to patient's condition.  Obtained from the patient's daughter at bedside. ? ?HPI:  Patient is a 76 years old female with past medical history of dementia with history of hallucinations and agitation during adult daycare, CKD stage III, hypertension, history of TIA presented to hospital with altered mental status confusion and disorientation more so than her baseline. Two months back patient had some behavioral issues and had difficulty recognizing people including family members.  She was then put on Depakote Aricept and risperidone after having consultation with neurology twice.  Patient currently goes to daycare..  As per the family patient has not been doing too well.  Recently she has been more confused disoriented confused with vacant stare has had neurology evaluation at least twice.  For the last day or 2, patient was noted to have incontinence of urine and was unable to ambulate.  There is no mention of nausea vomiting, trouble urinating. Recent neurology visit had suggested possible Parkinson's disease as well.   ? ?EMS reported that she was pulling off on everything in route to the hospital.In the ED, patient had stable vitals.  She was afebrile.  CBC with WBC count at 5.2.  VBG was unremarkable.  Drug screen including salicylate and Tylenol levels were unremarkable.  Valproic acid level was within normal range.  BMP showed sodium at 134 with creatinine of 1.4 which was slightly elevated from her baseline.  TSH at 2.2.  Urinalysis however showed leukocytes with more than 50 white cells.  Lactate was within normal range.  Urine culture was sent from the ED.  Patient was given Ringer lactate 1 L bolus, IV Rocephin and was considered  for admission to the hospital for acute metabolic encephalopathy likely secondary to UTI. ? ?Assessment and plan ? ?Principal Problem: ?  Altered mental status ?Active Problems: ?  Hypertension ?  UTI (urinary tract infection) ?  Mild dementia ([place]) ?  CKD (chronic kidney disease) stage 3, GFR 30-59 ml/min (HCC) ?  ?Altered mental status, on the background of underlying dementia/metabolic encephalopathy.   ?Mental status changes from her previous baseline.  At baseline, patient is able to go to the bathroom by herself and walk and intermittently recognizes her family and follow commands.  Possibility of UTI at this time.  We will continue with IV Rocephin.  Chest x-ray without obvious infiltrate.  No leukocytosis.  Valproate level was within normal limits.  There is a possibility of worsening dementia as well.  Patient has been declining over the last few months now.  N.p.o. until mentation improves.  Will the patient on D5 normal saline at this time.  Speech and swallow evaluation.  We will get palliative care consultation to define goals of care, decline in patient's condition. ? ?UTI.  UA abnormal.  Urine cultures have been sent.  Continue IV Rocephin. ? ?History of dementia with behavioral issues. ?On Depakote, risperidone as outpatient when p.o. able..  Valproic acid level was within normal limits. ? ?Mild AKI on CKD stage IIIa likely.  Continue with IV fluid hydration.  We will check  BMP in AM.  Continue hydration overnight. ? ?History of hypertension.  On losartan hydrochlorothiazide as outpatient.  We will hold for now.  Added as needed hydralazine  ? ?Hypothyroidism.  Continue Synthroid when p.o. able..  TSH on 09/13/2021 was 2.2. ? ?Deconditioning, debility, ambulatory dysfunction.  Continues to decline as per the family.  We will get PT evaluation in a.m. when able.  Patient is currently at home helped by the family.  Goes to daycare.  Might need skilled nursing level of care on discharge. ? ?DVT  Prophylaxis: Heparin subcu ? ?Review of Systems:  ?Limited due to patient's condition. ? ? ?Past Medical History:  ?Diagnosis Date  ? CKD (chronic kidney disease) stage 3, GFR 30-59 ml/min (HCC)   ? Hypertension   ? Mild dementia ([place])   ? TIA (transient ischemic attack)   ? ?Past Surgical History:  ?Procedure Laterality Date  ? CARPAL TUNNEL RELEASE    ? ? ?Social History:  reports that she has never smoked. She has never used smokeless tobacco. She reports that she does not drink alcohol and does not use drugs. ? ?No Known Allergies ? ?History reviewed. No pertinent family history.  ? ?Prior to Admission medications   ?Medication Sig Start Date End Date Taking? Authorizing Provider  ?acetaminophen (TYLENOL) 160 mg/5 mL SOLN Take 15 mLs by mouth 3 (three) times daily as needed. 09/12/21  Yes [provider]  ?aspirin 81 MG chewable tablet Chew 81 mg by mouth daily. 09/12/21  Yes [provider]  ?divalproex (DEPAKOTE) 125 MG DR tablet Take 250 tablets by mouth in the morning and at bedtime. 07/20/21  Yes [provider]  ?levothyroxine (SYNTHROID) 50 MCG tablet Take 50 mcg by mouth daily. 09/05/21  Yes [provider]  ?losartan-hydrochlorothiazide (HYZAAR) 100-25 MG tablet Take 1 tablet by mouth daily. 09/05/21  Yes [provider]  ?risperiDONE (RISPERDAL) 0.5 MG tablet Take 0.5 mg by mouth at bedtime. 08/29/21  Yes [provider]  ?ascorbic acid (VITAMIN C) 500 MG tablet Take 1 tablet (500 mg total) by mouth daily. ?Patient not taking: Reported on 09/13/2021 09/27/19   Nolberto Hanlon, MD  ?[facility]ethorphan-[facility] DM) 30-600 MG 12hr tablet Take 1 tablet by mouth 2 (two) times daily as needed for cough. ?Patient not taking: Reported on 09/13/2021 09/27/19   Nolberto Hanlon, MD  ?losartan (COZAAR) 25 MG tablet Take 25 mg by mouth daily. ?Patient not taking: Reported on 09/13/2021    [provider]  ?zinc sulfate 220 (50 Zn) MG capsule Take 1 capsule (220  mg total) by mouth daily. ?Patient not taking: Reported on 09/13/2021 09/27/19   Nolberto Hanlon, MD  ? ? ?Physical Exam: ?Vitals:  ? 09/13/21 1600 09/13/21 1630 09/13/21 1700 09/13/21 1730  ?BP: 136/73 117/70 (!) 108/56 (!) 105/54  ?Pulse: 79 67 66 66  ?Resp: '14 15 13 '$ (!) 26  ?Temp:      ?TempSrc:      ?SpO2: 97% 96% 97% 97%  ? ?Wt Readings from Last 3 Encounters:  ?09/23/19 72.6 kg  ?02/01/17 83 kg  ? ?There is no height or weight on file to calculate BMI. ? ?General:  Average built, not in obvious distress appears somnolent admitted ?HENT: Normocephalic, pupils equally reacting to light and accommodation.  No scleral pallor or icterus noted. Oral mucosa is moist.  ?Chest:  .  Diminished breath sounds bilaterally.  No obvious wheezes noted. ?CVS: S1 &S2 heard. No murmur.  Regular rate and rhythm. ?Abdomen: Soft, nontender, nondistended.  Bowel sounds are heard.  Liver is not palpable, no abdominal mass palpated ?Extremities: No cyanosis, clubbing or edema.  Peripheral pulses are palpable.  Bilateral upper extremity mittens in  place. ?Psych: somnolent at the time of my evaluation, ?CNS: Somnolent at the time of my evaluation, on mittens ?Skin: Warm and dry.  No rashes noted. ? ?Labs on Admission:  ? ?CBC: ?Recent Labs  ?Lab 09/13/21 ?1421  ?WBC 5.2  ?NEUTROABS 4.3  ?HGB 13.1  ?HCT 41.2  ?MCV 92.4  ?PLT 324  ? ? ?Basic Metabolic Panel: ?Recent Labs  ?Lab 09/13/21 ?1421  ?NA 134*  ?K 3.6  ?CL 97*  ?CO2 25  ?GLUCOSE 115*  ?BUN 25*  ?CREATININE 1.48*  ?CALCIUM 9.7  ? ? ?Liver Function Tests: ?Recent Labs  ?Lab 09/13/21 ?1421  ?AST 34  ?ALT 19  ?ALKPHOS 61  ?BILITOT 0.6  ?PROT 7.9  ?ALBUMIN 3.6  ? ?Recent Labs  ?Lab 09/13/21 ?1421  ?LIPASE 29  ? ?No results for input(s): AMMONIA in the last 168 hours. ? ?Cardiac Enzymes: ?No results for input(s): CKTOTAL, CKMB, CKMBINDEX, TROPONINI in the last 168 hours. ? ?BNP (last 3 results) ?Recent Labs  ?  09/13/21 ?1421  ?BNP 24.1  ? ? ?ProBNP (last 3 results) ?No results for input(s):  PROBNP in the last 8760 hours. ? ?CBG: ?No results for input(s): GLUCAP in the last 168 hours. ? ?Lipase  ?   ?Component Value Date/Time  ? LIPASE 29 09/13/2021 1421  ?  ? ?Urinalysis ?   ?Component Value Date/

## 2021-09-13 NOTE — ED Notes (Signed)
XR notified that pt available for XR ?

## 2021-09-13 NOTE — ED Triage Notes (Signed)
Per EMS pt has AMS. Hx of dementia. Family states they think she is progressing. Pt having loose bowels, urinating on herself that started this morning. Per family pt has not been acting herself. Per EMS pt is in the process of being dx with parkinson.  ? ?EMS states pt was pulling off everything on the ride to the hospital. ? ?EMS VS ?HR 138 ?Temp 97.8 ?BP 114/86 ?BG 126 ? ?

## 2021-09-13 NOTE — Plan of Care (Signed)

## 2021-09-14 DIAGNOSIS — F03A2 Unspecified dementia, mild, with psychotic disturbance: Secondary | ICD-10-CM | POA: Diagnosis present

## 2021-09-14 DIAGNOSIS — Z79899 Other long term (current) drug therapy: Secondary | ICD-10-CM | POA: Diagnosis not present

## 2021-09-14 DIAGNOSIS — F03B3 Unspecified dementia, moderate, with mood disturbance: Secondary | ICD-10-CM | POA: Diagnosis not present

## 2021-09-14 DIAGNOSIS — R54 Age-related physical debility: Secondary | ICD-10-CM | POA: Diagnosis not present

## 2021-09-14 DIAGNOSIS — F03918 Unspecified dementia, unspecified severity, with other behavioral disturbance: Secondary | ICD-10-CM

## 2021-09-14 DIAGNOSIS — F03A18 Unspecified dementia, mild, with other behavioral disturbance: Secondary | ICD-10-CM | POA: Diagnosis present

## 2021-09-14 DIAGNOSIS — N390 Urinary tract infection, site not specified: Secondary | ICD-10-CM | POA: Diagnosis present

## 2021-09-14 DIAGNOSIS — Z79890 Hormone replacement therapy (postmenopausal): Secondary | ICD-10-CM | POA: Diagnosis not present

## 2021-09-14 DIAGNOSIS — Z66 Do not resuscitate: Secondary | ICD-10-CM | POA: Diagnosis present

## 2021-09-14 DIAGNOSIS — N179 Acute kidney failure, unspecified: Secondary | ICD-10-CM

## 2021-09-14 DIAGNOSIS — F0394 Unspecified dementia, unspecified severity, with anxiety: Secondary | ICD-10-CM

## 2021-09-14 DIAGNOSIS — I129 Hypertensive chronic kidney disease with stage 1 through stage 4 chronic kidney disease, or unspecified chronic kidney disease: Secondary | ICD-10-CM | POA: Diagnosis present

## 2021-09-14 DIAGNOSIS — N3000 Acute cystitis without hematuria: Secondary | ICD-10-CM | POA: Diagnosis not present

## 2021-09-14 DIAGNOSIS — Z7189 Other specified counseling: Secondary | ICD-10-CM

## 2021-09-14 DIAGNOSIS — Z8616 Personal history of COVID-19: Secondary | ICD-10-CM | POA: Diagnosis not present

## 2021-09-14 DIAGNOSIS — Z1611 Resistance to penicillins: Secondary | ICD-10-CM | POA: Diagnosis present

## 2021-09-14 DIAGNOSIS — E039 Hypothyroidism, unspecified: Secondary | ICD-10-CM

## 2021-09-14 DIAGNOSIS — R4586 Emotional lability: Secondary | ICD-10-CM | POA: Diagnosis present

## 2021-09-14 DIAGNOSIS — R4182 Altered mental status, unspecified: Secondary | ICD-10-CM | POA: Diagnosis not present

## 2021-09-14 DIAGNOSIS — R5381 Other malaise: Secondary | ICD-10-CM | POA: Diagnosis present

## 2021-09-14 DIAGNOSIS — R269 Unspecified abnormalities of gait and mobility: Secondary | ICD-10-CM | POA: Diagnosis present

## 2021-09-14 DIAGNOSIS — G9341 Metabolic encephalopathy: Secondary | ICD-10-CM | POA: Diagnosis present

## 2021-09-14 DIAGNOSIS — Z7982 Long term (current) use of aspirin: Secondary | ICD-10-CM | POA: Diagnosis not present

## 2021-09-14 DIAGNOSIS — R159 Full incontinence of feces: Secondary | ICD-10-CM | POA: Diagnosis present

## 2021-09-14 DIAGNOSIS — Z8673 Personal history of transient ischemic attack (TIA), and cerebral infarction without residual deficits: Secondary | ICD-10-CM | POA: Diagnosis not present

## 2021-09-14 DIAGNOSIS — N1831 Chronic kidney disease, stage 3a: Secondary | ICD-10-CM | POA: Diagnosis present

## 2021-09-14 DIAGNOSIS — R32 Unspecified urinary incontinence: Secondary | ICD-10-CM | POA: Diagnosis present

## 2021-09-14 DIAGNOSIS — Z515 Encounter for palliative care: Secondary | ICD-10-CM | POA: Diagnosis not present

## 2021-09-14 LAB — BASIC METABOLIC PANEL
Anion gap: 10 (ref 5–15)
BUN: 23 mg/dL (ref 8–23)
CO2: 26 mmol/L (ref 22–32)
Calcium: 9.4 mg/dL (ref 8.9–10.3)
Chloride: 101 mmol/L (ref 98–111)
Creatinine, Ser: 1.12 mg/dL — ABNORMAL HIGH (ref 0.44–1.00)
GFR, Estimated: 52 mL/min — ABNORMAL LOW (ref >60)
Glucose, Bld: 102 mg/dL — ABNORMAL HIGH (ref 70–99)
Potassium: 3.1 mmol/L — ABNORMAL LOW (ref 3.5–5.1)
Sodium: 137 mmol/L (ref 135–145)

## 2021-09-14 LAB — CBC
HCT: 37.2 % (ref 36.0–46.0)
Hemoglobin: 12 g/dL (ref 12.0–15.0)
MCH: 29.1 pg (ref 26.0–34.0)
MCHC: 32.3 g/dL (ref 30.0–36.0)
MCV: 90.1 fL (ref 80.0–100.0)
Platelets: 308 10*3/uL (ref 150–400)
RBC: 4.13 MIL/uL (ref 3.87–5.11)
RDW: 13.2 % (ref 11.5–15.5)
WBC: 4.8 10*3/uL (ref 4.0–10.5)
nRBC: 0 % (ref 0.0–0.2)

## 2021-09-14 LAB — MAGNESIUM: Magnesium: 1.9 mg/dL (ref 1.7–2.4)

## 2021-09-14 MED ORDER — DIVALPROEX SODIUM 125 MG PO CSDR
250.0000 mg | DELAYED_RELEASE_CAPSULE | Freq: Two times a day (BID) | ORAL | Status: DC
  Administered 2021-09-14 – 2021-09-15 (×2): 250 mg via ORAL
  Filled 2021-09-14 (×2): qty 2

## 2021-09-14 NOTE — Consult Note (Addendum)
Consultation Note Date: 09/14/2021   Patient Name: Victoria Wilkerson  DOB: Jan 15, 1946  MRN: [id number]  Age / Sex: 76 y.o., female  PCP: [facility] [place] Referring Physician: Annita Brod, MD  Reason for Consultation: goals of care   HPI/Patient Profile: 76 y.o. female  with past medical history of dementia with behavioral disturbance, CKD, HTN, TIA's admitted on 09/13/2021 with altered mental status, confusion, disorientation increased from her baseline. Workup reveals UTI. Palliative medicine consulted for [place].    Primary Decision Maker HCPOA - Bobbi Pennix and Sara Keys  Discussion: I have reviewed medical records including Care Everywhere, progress notes from this and prior admissions, labs and imaging, discussed with RN.  On evaluation patient is awake and alert. She is able to identify her two daughters who are in the room.   I introduced Palliative Medicine as specialized medical care for people living with serious illness. It focuses on providing relief from the symptoms and stress of a serious illness. The goal is to improve quality of life for both the patient and the family.  As far as functional and nutritional status prior to this admission patient was able to ambulate, required assistance with ADL's, recently has become incontinent of bowel and bladder.   She was attending adult daycare- however, due to her behaviors requiring one on one caretakers the daycare now shares it cannot provide care any longer. [name] works full time and is unable to stay home with Jametta, and Glenn requires 24 hour care.   We discussed patient's current illness and what it means in the larger context of patient's on-going co-morbidities.  Natural disease trajectory and expectations at EOL were discussed.  Bobbi and [name] are aware that patient's dementia is progressive and will worsen  over time. Advance directives have been completed and scanned into chart- I reviewed these with the patient and her daughters.   Discussed with patient/family the importance of continued conversation with family and the medical providers regarding overall plan of care and treatment options, ensuring decisions are within the context of the patient's values and GOCs.    At this time primary [place] are to stabilize patient and seek placement in a nursing facility. They do not feel she is safe to be at home any longer.   Questions and concerns were addressed. The family was encouraged to call with questions or concerns.      SUMMARY OF RECOMMENDATIONS -Continue current treatment for UTI -Advance directives are on chart, she is DNR -Family would like to see placement in SNF, they have applied for assistance with [facility] but their application is pending    Code Status/Advance Care Planning: DNR   Prognosis:   Unable to determine  Discharge Planning: [place] for rehab with Palliative care service follow-up  Primary Diagnoses: Present on Admission:  Altered mental status  Hypertension  Mild dementia ([place])  UTI (urinary tract infection)   Review of Systems  Physical Exam Constitutional:      Comments: Frail  Neurological:  Mental Status: She is alert.     Comments: Oriented x 2 (person and place), able to identify her daughters  Psychiatric:     Comments: Labile, constant crying    Vital Signs: BP 132/69 (BP Location: Left Arm)   Pulse 92   Temp 98 F (36.7 C) (Axillary)   Resp 16   SpO2 98%  Pain Scale: 0-10   Pain Score: 0-No pain   SpO2: SpO2: 98 % O2 Device:SpO2: 98 % O2 Flow Rate: .   IO: Intake/output summary:  Intake/Output Summary (Last 24 hours) at 09/14/2021 1545 Last data filed at 09/14/2021 1500 Gross per 24 hour  Intake 1073.47 ml  Output --  Net 1073.47 ml    LBM: Last BM Date :  (unknown) Baseline Weight:   Most recent weight:          Thank you for this consult. Palliative medicine will continue to follow and assist as needed.   Greater than 50%  of this time was spent counseling and coordinating care related to the above assessment and plan.  Signed by: Mariana Kaufman, AGNP-C Palliative Medicine    Please contact Palliative Medicine Team phone at [phone] for questions and concerns.  For individual provider: See Shea Evans

## 2021-09-14 NOTE — Evaluation (Signed)
Physical Therapy Evaluation ?Patient Details ?Name: Victoria Wilkerson ?MRN: [id number] ?DOB: 1946/03/29 ?Today's Date: 09/14/2021 ? ?History of Present Illness ? Pt is a 76 years old female with past medical history of worsening Dementia with history of hallucinations and agitation during adult daycare, CKD stage III, hypertension, history of TIA presented to hospital with altered mental status confusion and disorientation more so than her baseline. Diagnosed with UTI ?  ?Clinical Impression ? Patient received in recliner with daughter in room. Patient is agreeable to PT assessment, confused, pleasant. Flat affect a labile during session. She is able to stand with min guard. Cues needed for hand placement on RW. She ambulated 75 feet with min guard, RW and single UE support. Min guard/min A for both for safety. She is ambulating at slow pace with shuffle steps indicating increased fall risk. Patient will continue to benefit from skilled PT while here to improve functional independence and safety with mobility to reduce caregiver burden.      ?   ? ?Recommendations for follow up therapy are one component of a multi-disciplinary discharge planning process, led by the attending physician.  Recommendations may be updated based on patient status, additional functional criteria and insurance authorization. ? ?Follow Up Recommendations Skilled nursing-short term rehab (<3 hours/day) ? ?  ?Assistance Recommended at Discharge Frequent or constant Supervision/Assistance  ?Patient can return home with the following ? A little help with bathing/dressing/bathroom;Assist for transportation;Help with stairs or ramp for entrance;A little help with walking and/or transfers;Direct supervision/assist for financial management;Assistance with cooking/housework;Direct supervision/assist for medications management ? ?  ?Equipment Recommendations Rolling walker (2 wheels)  ?Recommendations for Other Services ?    ?  ?Functional Status Assessment  Patient has had a recent decline in their functional status and demonstrates the ability to make significant improvements in function in a reasonable and predictable amount of time.  ? ?  ?Precautions / Restrictions Precautions ?Precautions: Fall ?Restrictions ?Weight Bearing Restrictions: No  ? ?  ? ?Mobility ? Bed Mobility ?  ?  ?  ?  ?  ?  ?  ?General bed mobility comments: NT ?  ? ?Transfers ?Overall transfer level: Needs assistance ?Equipment used: Rolling walker (2 wheels) ?Transfers: Sit to/from Stand ?Sit to Stand: Min guard ?  ?  ?  ?  ?  ?  ?  ? ?Ambulation/Gait ?Ambulation/Gait assistance: Min guard ?Gait Distance (Feet): 75 Feet ?Assistive device: Rolling walker (2 wheels), 1 person hand held assist ?Gait Pattern/deviations: Step-through pattern, Decreased step length - right, Decreased step length - left, Decreased stride length, Shuffle ?Gait velocity: decr ?  ?  ?General Gait Details: patient able to ambulate with single UE support, min guard. And ambulated with RW, min guard, cues for safety. Otherwise steady with mobility ? ?Stairs ?  ?  ?  ?  ?  ? ?Wheelchair Mobility ?  ? ?Modified Rankin (Stroke Patients Only) ?  ? ?  ? ?Balance Overall balance assessment: Needs assistance ?Sitting-balance support: Feet supported ?Sitting balance-Leahy Scale: Good ?  ?  ?Standing balance support: Single extremity supported, Bilateral upper extremity supported, During functional activity, Reliant on assistive device for balance ?Standing balance-Leahy Scale: Fair ?  ?  ?  ?  ?  ?  ?  ?  ?  ?  ?  ?  ?   ? ? ? ?Pertinent Vitals/Pain Pain Assessment ?Pain Assessment: No/denies pain  ? ? ?Home Living Family/patient expects to be discharged to:: Skilled nursing facility ?  ?  ?  ?  ?  ?  ?  ?  ?  ?   ?  ?  Prior Function Prior Level of Function : Needs assist ?  ?  ?  ?Physical Assist : Mobility (physical);ADLs (physical) ?Mobility (physical): Gait ?ADLs (physical): Bathing;Dressing ?Mobility Comments: patient was able  to ambulate without AD prior to admission ?ADLs Comments: required assist with dressing, bathing ?  ? ? ?Hand Dominance  ?   ? ?  ?Extremity/Trunk Assessment  ? Upper Extremity Assessment ?Upper Extremity Assessment: Generalized weakness ?  ? ?Lower Extremity Assessment ?Lower Extremity Assessment: Generalized weakness ?  ? ?Cervical / Trunk Assessment ?Cervical / Trunk Assessment: Normal  ?Communication  ? Communication: No difficulties  ?Cognition Arousal/Alertness: Awake/alert ?Behavior During Therapy: Flat affect ?Overall Cognitive Status: Impaired/Different from baseline ?Area of Impairment: Following commands, Attention, Problem solving ?  ?  ?  ?  ?  ?  ?  ?  ?  ?Current Attention Level: Sustained ?  ?Following Commands: Follows one step commands with increased time ?  ?  ?Problem Solving: Slow processing, Requires verbal cues, Requires tactile cues, Decreased initiation ?  ?  ?  ? ?  ?General Comments   ? ?  ?Exercises    ? ?Assessment/Plan  ?  ?PT Assessment Patient needs continued PT services  ?PT Problem List Decreased strength;Decreased mobility;Decreased activity tolerance;Decreased balance;Decreased cognition;Decreased safety awareness;Decreased knowledge of use of DME ? ?   ?  ?PT Treatment Interventions DME instruction;Therapeutic exercise;Gait training;Balance training;Stair training;Functional mobility training;Therapeutic activities;Patient/family education;Cognitive remediation   ? ?PT Goals (Current goals can be found in the Care Plan section)  ?Acute Rehab PT Goals ?Patient Stated Goal: to get stronger and reduce caregiver needs ?PT Goal Formulation: With family ?Time For Goal Achievement: 09/28/21 ?Potential to Achieve Goals: Fair ? ?  ?Frequency Min 2X/week ?  ? ? ?Co-evaluation   ?  ?  ?  ?  ? ? ?  ?AM-PAC PT "6 Clicks" Mobility  ?Outcome Measure Help needed turning from your back to your side while in a flat bed without using bedrails?: A Little ?Help needed moving from lying on your back  to sitting on the side of a flat bed without using bedrails?: A Little ?Help needed moving to and from a bed to a chair (including a wheelchair)?: A Little ?Help needed standing up from a chair using your arms (e.g., wheelchair or bedside chair)?: A Little ?Help needed to walk in hospital room?: A Little ?Help needed climbing 3-5 steps with a railing? : A Little ?6 Click Score: 18 ? ?  ?End of Session Equipment Utilized During Treatment: Gait belt ?Activity Tolerance: Patient tolerated treatment well ?Patient left: in chair;with chair alarm set;with family/visitor present ?Nurse Communication: Mobility status ?PT Visit Diagnosis: Other abnormalities of gait and mobility (R26.89);Difficulty in walking, not elsewhere classified (R26.2);Muscle weakness (generalized) (M62.81) ?  ? ?Time: 2778-2423 ?PT Time Calculation (min) (ACUTE ONLY): 18 min ? ? ?Charges:   PT Evaluation ?$PT Eval Moderate Complexity: 1 Mod ?PT Treatments ?$Gait Training: 8-22 mins ?  ?   ? ? ?Amanda Cockayne, PT, GCS ?09/14/21,12:08 PM ? ?

## 2021-09-14 NOTE — Assessment & Plan Note (Addendum)
Urine cultures grew out 100,000 colonies of penicillin resistant E. coli, but otherwise pansensitive.  Patient changed over from IV Rocephin to p.o. Bactrim.  However, Bactrim with much lower sensitivities than the Cipro.  So we will discharge on p.o. Cipro until 5/22 to complete a full 7-day course.

## 2021-09-14 NOTE — Assessment & Plan Note (Deleted)
Acute kidney injury in the setting of stage IIIa chronic kidney disease.  Presented with creatinine of 1.48 and GFR 37.  With fluids, creatinine down to 1.12. ?

## 2021-09-14 NOTE — Evaluation (Addendum)
Clinical/Bedside Swallow Evaluation Patient Details  Name: Victoria Wilkerson MRN: [id number] Date of Birth: 1945-12-01  Today's Date: 09/14/2021 Time: SLP Start Time (ACUTE ONLY): 0905 SLP Stop Time (ACUTE ONLY): 0955 SLP Time Calculation (min) (ACUTE ONLY): 50 min  Past Medical History:  Past Medical History:  Diagnosis Date   CKD (chronic kidney disease) stage 3, GFR 30-59 ml/min (HCC)    Hypertension    Mild dementia ([place])    TIA (transient ischemic attack)    Past Surgical History:  Past Surgical History:  Procedure Laterality Date   CARPAL TUNNEL RELEASE     HPI:  Pt is a 76 years old female with past medical history of worsening Dementia with history of hallucinations and agitation during adult daycare, CKD stage III, hypertension, history of TIA presented to hospital with altered mental status confusion and disorientation more so than her baseline. Two months back patient had some behavioral issues and had difficulty recognizing people including family members.  She was then put on Depakote Aricept and risperidone after having consultation with neurology twice.  Patient currently goes to daycare..  As per the family patient has not been doing too well.  Recently she has been more confused disoriented with vacant stare has had neurology evaluation at least twice.  For the last day or 2, patient was noted to have incontinence of urine and was unable to ambulate.  There is no mention of nausea vomiting, trouble urinating. Recent neurology visit had suggested possible Parkinson's disease as well.  At baseline, patient is able to go to the bathroom by herself and walk and intermittently recognizes her family and follow commands.  Possibility of UTI at this time.  We will continue with IV Rocephin.  Chest x-ray without obvious infiltrate.    Assessment / Plan / Recommendation  Clinical Impression  Pt appears to present w/ oropharyngeal phase dysphagia in setting of declined Cognitive status;  Baseline Dementia. Family has reported that pt's Dementia may "worsening"; noted MD note. She has been attending Adult Care during the day and can recognize family members; ambulatory per chart notes.  Cognitive decline can impact her overall awareness/timing of swallow and safety during po tasks which increases risk for aspiration, choking. Pt's risk for aspiration can be reduced when following general aspiration precautions and using a modified diet consistency of broken down foods d/t Edentulous status at baseline(does not wear Dentures at home per family). She required min-mod verbal/visual cues for follow through during po tasks and self-feeding.       Pt consumed several trials of ice chips, purees, minced solids and thin liquids via cup/straw w/ No overt clinical s/s of aspiration noted: no decline in vocal quality; no cough, and no decline in respiratory status during/post trials. Oral phase was adequate for bolus management and oral clearing of the boluses given. Mastication of softened solids adequate given Time and moistening the foods broken small. Pt attempted self-feeding but required min-mod support and guidance d/t the Cognitive decline. She was able to feed self which improves safety of swallowing. OM Exam appeared [facility] w/ No unilateral weakness noted. Some confusion of OM tasks and oral care noted. Hand over hand guidance and visual cue was helpful to initiate oral care task.        In setting of baseline Dementia and Cognitive decline, Edentulous status, and her risk for aspiration, recommend initiation of the dysphagia level 2(MINCED foods moistened for ease of oral phase/mastication) w/ thin liquids; general aspiration precautions; reduce Distractions during meals  and engage pt during meals for self-feeding. Pills Crushed in Puree for safer swallowing as needed. Support w/ feeding at meals as needed. MD/NSG updated.  ST services recommends follow w/ Palliative Care for [place] and education re:  impact of Cognitive decline/Dementia on swallowing. Suspect pt is close to/at her baseline. Precautions posted in room. SLP Visit Diagnosis: Dysphagia, unspecified (R13.10) (Edentulous at baseline impacting mastication effectiveness)    Aspiration Risk   (reduced following general aspiration precautions)    Diet Recommendation   dysphagia level 2(MINCED foods moistened for ease of oral phase/mastication) w/ thin liquids; general aspiration precautions; reduce Distractions during meals and engage pt during meals for self-feeding. Support w/ feeding at meals.  Medication Administration: Crushed with puree (as needed d/t Cognitive decline)    Other  Recommendations Recommended Consults:  (Palliative Care; Dietician f/u) Oral Care Recommendations: Oral care BID;Oral care before and after PO;Staff/trained caregiver to provide oral care Other Recommendations:  (n/a)    Recommendations for follow up therapy are one component of a multi-disciplinary discharge planning process, led by the attending physician.  Recommendations may be updated based on patient status, additional functional criteria and insurance authorization.  Follow up Recommendations No SLP follow up (pt appears at her baseline per family report)      Assistance Recommended at Discharge Intermittent Supervision/Assistance (d/t Cognitive decline)  Functional Status Assessment Patient has had a recent decline in their functional status and/or demonstrates limited ability to make significant improvements in function in a reasonable and predictable amount of time  Frequency and Duration  (n/a)   (n/a)       Prognosis Prognosis for Safe Diet Advancement: Fair Barriers to Reach Goals: Cognitive deficits;Time post onset;Severity of deficits;Language deficits;Behavior Barriers/Prognosis Comment: Dementia      Swallow Study   General Date of Onset: 09/13/21 HPI: Pt is a 76 years old female with past medical history of worsening  Dementia with history of hallucinations and agitation during adult daycare, CKD stage III, hypertension, history of TIA presented to hospital with altered mental status confusion and disorientation more so than her baseline. Two months back patient had some behavioral issues and had difficulty recognizing people including family members.  She was then put on Depakote Aricept and risperidone after having consultation with neurology twice.  Patient currently goes to daycare..  As per the family patient has not been doing too well.  Recently she has been more confused disoriented confused with vacant stare has had neurology evaluation at least twice.  For the last day or 2, patient was noted to have incontinence of urine and was unable to ambulate.  There is no mention of nausea vomiting, trouble urinating. Recent neurology visit had suggested possible Parkinson's disease as well.  At baseline, patient is able to go to the bathroom by herself and walk and intermittently recognizes her family and follow commands.  Possibility of UTI at this time.  We will continue with IV Rocephin.  Chest x-ray without obvious infiltrate. Type of Study: Bedside Swallow Evaluation Previous Swallow Assessment: none Diet Prior to this Study: NPO Temperature Spikes Noted: No (wbc 4.8) Respiratory Status: Room air History of Recent Intubation: No Behavior/Cognition: Alert;Cooperative;Confused;Pleasant mood;Distractible;Requires cueing Oral Cavity Assessment: Within Functional Limits Oral Care Completed by SLP: Yes Oral Cavity - Dentition: Edentulous Vision: Functional for self-feeding Self-Feeding Abilities: Able to feed self;Needs assist;Needs set up (cues) Patient Positioning: Upright in chair Baseline Vocal Quality: Normal (speaks softly - suspect related to Dementia) Volitional Cough: Cognitively unable to elicit Volitional  Swallow: Unable to elicit    Oral/Motor/Sensory Function Overall Oral Motor/Sensory Function:  Within functional limits (appropriate bolus management and oral clearing)   Ice Chips Ice chips: Within functional limits Presentation: Spoon (fed; 2 trials)   Thin Liquid Thin Liquid: Within functional limits Presentation: Cup;Self Fed;Straw (3 trials via cup; ~6 ozs via straw) Other Comments: water, juice, ensure    Nectar Thick Nectar Thick Liquid: Not tested   Honey Thick Honey Thick Liquid: Not tested   Puree Puree: Within functional limits Presentation: Self Fed;Spoon (~8 ozs)   Solid     Solid: Impaired Presentation: Self Fed (8 trials) Oral Phase Impairments: Impaired mastication (edentulous) Pharyngeal Phase Impairments:  (none)        Orinda Kenner, MS, CCC-SLP Speech Language Pathologist Rehab Services; [place] [phone] (ascom) Princess Karnes 09/14/2021,10:00 AM

## 2021-09-14 NOTE — Progress Notes (Signed)
[facility] Progress Note ? ?Patient: Victoria Wilkerson    GXQ:[id number]  DOA: 09/13/2021    ?Date of Service: the patient was seen and examined on 09/14/2021 ? ?Brief hospital course: ?76 year old with past medical history of dementia, stage III chronic kidney disease and hypertension from home who goes to daily adult daycare presented to the emergency room on 5/16 with increased confusion felt to be secondary to urinary tract infection.  Treated with IV fluids and antibiotics.  Seen by PT who recommended skilled nursing. ? ?Assessment and Plan: ?Assessment and Plan: ?* Senile dementia with behavioral disturbance ([place]) ?Secondary to urinary tract infection.  Following fluids and antibiotics, now close to baseline.  Continue Risperdal and Depakote. ? ?UTI (urinary tract infection) ?Urine cultures growing 80,000 colonies of gram-negative rods.  Currently on IV Rocephin.  Continue to monitor. ? ?Acute renal failure superimposed on stage 3a chronic kidney disease ([place]) ?Acute kidney injury in the setting of stage IIIa chronic kidney disease.  Presented with creatinine of 1.48 and GFR 37.  With fluids, creatinine down to 1.12. ? ?Hypertension ?Blood pressure stable.  Continue home medications. ? ?Hypothyroidism ?Continue Synthroid. ? ? ? ? ? ? ?There is no height or weight on file to calculate BMI.  ?  ?   ? ?Consultants: ?Palliative care ? ?Procedures: ?EEG pending ? ?Antimicrobials: ?IV Rocephin 5/16-present ? ?Code Status: DNR ? ? ?Subjective: Currently patient resting comfortably ? ?Objective: ?Vital signs were reviewed and unremarkable. ?Vitals:  ? 09/14/21 0726 09/14/21 1653  ?BP: 132/69 115/65  ?Pulse: 92 82  ?Resp:  17  ?Temp: 98 ?F (36.7 ?C) 98.8 ?F (37.1 ?C)  ?SpO2: 98% 100%  ? ? ?Intake/Output Summary (Last 24 hours) at 09/14/2021 1654 ?Last data filed at 09/14/2021 1500 ?Gross per 24 hour  ?Intake 1073.47 ml  ?Output --  ?Net 1073.47 ml  ? ?There were no vitals filed for this visit. ?There is no height or  weight on file to calculate BMI. ? ?Exam: ? ?General: Resting comfortably ?HEENT: Normocephalic, atraumatic, mucous membranes slightly dry ?Cardiovascular: Regular rate and rhythm, S1-S2 ?Respiratory: Clear to auscultation bilaterally ?Abdomen: Soft, nondistended, nontender, hypoactive bowel sounds ?Musculoskeletal: No clubbing or cyanosis or edema ?Skin: No skin breaks, tears or lesions ?Psychiatry: Underlying dementia and confusion, but no acute psychosis ?Neurology: No focal deficits ? ?Data Reviewed: ?Creatinine down to 1.12 with GFR 52.,  Stable normal white blood cell count ? ?Disposition:  ?Status is: Inpatient ? ?Anticipated discharge date: 5/19, when authorized for acute skilled nursing rehab ? ? ?Family Communication: Left message with family ?DVT Prophylaxis: ?heparin injection 5,000 Units Start: 09/13/21 2200 ? ? ? ?Author: ?Annita Brod ,MD ?09/14/2021 4:54 PM ? ?To reach On-call, see care teams to locate the attending and reach out via [email or web address]. ?Between 7PM-7AM, please contact night-coverage ?If you still have difficulty reaching the attending provider, please page the [facility] (Director on Call) for [facility] on amion for assistance. ? ?

## 2021-09-14 NOTE — Assessment & Plan Note (Addendum)
Blood pressure has been stable during hospitalization off of her home medications.  Given the acute kidney injury, will discontinue her ARB combo medication for now.  Consider restarting losartan by itself should blood pressure start to trend upward.

## 2021-09-14 NOTE — Progress Notes (Signed)
Eeg done 

## 2021-09-14 NOTE — Assessment & Plan Note (Signed)
Continue Synthroid °

## 2021-09-14 NOTE — Assessment & Plan Note (Addendum)
Acute kidney injury in the setting of stage IIIa chronic kidney disease.  Presented with creatinine of 1.48 and GFR 37.  With fluids, creatinine down to 1.04 with GFR 57 by day before discharge.

## 2021-09-14 NOTE — Assessment & Plan Note (Addendum)
Secondary to urinary tract infection.  Following fluids and antibiotics, now close to baseline.  Continue Risperdal.  Patient more emotionally labile today.  Seen by palliative care and discussion with neurology, increased Depakote to 250 mg 3 times daily.  Patient still quite easily tearful and have started very low-dose Ativan, 0.25 mg every 6 hours which has seemed to help.  Incidentally EEG done on admission to rule out seizure and it was negative.

## 2021-09-15 DIAGNOSIS — F03B3 Unspecified dementia, moderate, with mood disturbance: Secondary | ICD-10-CM

## 2021-09-15 DIAGNOSIS — N179 Acute kidney failure, unspecified: Secondary | ICD-10-CM | POA: Diagnosis not present

## 2021-09-15 DIAGNOSIS — N3000 Acute cystitis without hematuria: Secondary | ICD-10-CM | POA: Diagnosis not present

## 2021-09-15 DIAGNOSIS — F03918 Unspecified dementia, unspecified severity, with other behavioral disturbance: Secondary | ICD-10-CM | POA: Diagnosis not present

## 2021-09-15 DIAGNOSIS — R54 Age-related physical debility: Secondary | ICD-10-CM

## 2021-09-15 DIAGNOSIS — E039 Hypothyroidism, unspecified: Secondary | ICD-10-CM | POA: Diagnosis not present

## 2021-09-15 LAB — URINE CULTURE: Culture: >=100000 — AB

## 2021-09-15 LAB — BASIC METABOLIC PANEL
Anion gap: 9 (ref 5–15)
BUN: 26 mg/dL — ABNORMAL HIGH (ref 8–23)
CO2: 27 mmol/L (ref 22–32)
Calcium: 9.1 mg/dL (ref 8.9–10.3)
Chloride: 101 mmol/L (ref 98–111)
Creatinine, Ser: 1.04 mg/dL — ABNORMAL HIGH (ref 0.44–1.00)
GFR, Estimated: 57 mL/min — ABNORMAL LOW (ref >60)
Glucose, Bld: 92 mg/dL (ref 70–99)
Potassium: 3.7 mmol/L (ref 3.5–5.1)
Sodium: 137 mmol/L (ref 135–145)

## 2021-09-15 MED ORDER — SULFAMETHOXAZOLE-TRIMETHOPRIM 800-160 MG PO TABS
1.0000 | ORAL_TABLET | Freq: Once | ORAL | Status: AC
  Administered 2021-09-15: 1 via ORAL
  Filled 2021-09-15: qty 1

## 2021-09-15 MED ORDER — DIVALPROEX SODIUM 125 MG PO CSDR
250.0000 mg | DELAYED_RELEASE_CAPSULE | Freq: Three times a day (TID) | ORAL | Status: DC
  Administered 2021-09-15 – 2021-09-16 (×3): 250 mg via ORAL
  Filled 2021-09-15 (×4): qty 2

## 2021-09-15 MED ORDER — LORAZEPAM 0.5 MG PO TABS
0.2500 mg | ORAL_TABLET | Freq: Four times a day (QID) | ORAL | Status: DC | PRN
  Administered 2021-09-16: 0.25 mg via ORAL
  Filled 2021-09-15: qty 1

## 2021-09-15 MED ORDER — LORAZEPAM 2 MG/ML IJ SOLN: 0.5000 mg | Freq: Once | INTRAMUSCULAR | Status: DC

## 2021-09-15 MED ORDER — LORAZEPAM 0.5 MG PO TABS
0.2500 mg | ORAL_TABLET | ORAL | Status: AC
  Administered 2021-09-15: 0.25 mg via ORAL
  Filled 2021-09-15: qty 1

## 2021-09-15 MED ORDER — SULFAMETHOXAZOLE-TRIMETHOPRIM 800-160 MG PO TABS
1.0000 | ORAL_TABLET | Freq: Two times a day (BID) | ORAL | Status: DC
Start: 2021-09-15 — End: 2021-09-16
  Administered 2021-09-15 – 2021-09-16 (×2): 1 via ORAL
  Filled 2021-09-15 (×2): qty 1

## 2021-09-15 NOTE — TOC Initial Note (Signed)
Transition of Care [facility]) - Initial/Assessment Note    Patient Details  Name: Victoria Wilkerson MRN: [id number] Date of Birth: 05-May-1945  Transition of Care [facility]) CM/SW Contact:    Shelbie Hutching, RN Phone Number: 09/15/2021, 5:10 PM  Clinical Narrative:                 Patient admitted to the hospital with mental status changes, altered mental status, patient has history of dementia.   Patient is from home with her daughter, Victoria Wilkerson.  Family thinks that they may not be able to care for her at home any longer.  PT is recommending SNF, family agrees.  Patient has Medicare and Medicaid.  Bed search started.  Family prefers [facility] as it is close to where they live.  Pas[place] is pending. Patient will be medically ready for discharge tomorrow.    Expected Discharge Plan: Skilled Nursing Facility Barriers to Discharge: Continued Medical Work up   Patient Goals and CMS Choice Patient states their goals for this hospitalization and ongoing recovery are:: family wants rehab and then transition to LTC CMS Medicare.gov Compare Post Acute Care list provided to:: Patient Represenative (must comment) Choice offered to / list presented to : Adult Children  Expected Discharge Plan and Services Expected Discharge Plan: [name]   Discharge Planning Services: CM Consult Post Acute Care Choice: [place] Living arrangements for the past 2 months: Apartment                 DME Arranged: N/A DME Agency: NA       HH Arranged: NA [place]: NA        Prior Living Arrangements/Services Living arrangements for the past 2 months: Apartment Lives with:: Adult Children Patient language and need for interpreter reviewed:: Yes Do you feel safe going back to the place where you live?: Yes      Need for Family Participation in Patient Care: Yes (Comment) Care giver support system in place?: Yes (comment)   Criminal Activity/Legal Involvement Pertinent to Current  Situation/Hospitalization: No - Comment as needed  Activities of Daily Living Home Assistive Devices/Equipment: None ADL Screening (condition at time of admission) Patient's cognitive ability adequate to safely complete daily activities?: No Is the patient deaf or have difficulty hearing?: No Does the patient have difficulty seeing, even when wearing glasses/contacts?: Yes Does the patient have difficulty concentrating, remembering, or making decisions?: Yes Patient able to express need for assistance with ADLs?: No Does the patient have difficulty dressing or bathing?: Yes Independently performs ADLs?: No Communication: Dependent Is this a change from baseline?: Pre-admission baseline Dressing (OT): Dependent Is this a change from baseline?: Pre-admission baseline Grooming: Dependent Is this a change from baseline?: Pre-admission baseline Feeding: Dependent Is this a change from baseline?: Pre-admission baseline Bathing: Dependent Is this a change from baseline?: Pre-admission baseline Toileting: Dependent Is this a change from baseline?: Pre-admission baseline In/Out Bed: Dependent Is this a change from baseline?: Pre-admission baseline Walks in Home: Needs assistance Is this a change from baseline?: Pre-admission baseline Does the patient have difficulty walking or climbing stairs?: Yes Weakness of Legs: Both Weakness of Arms/Hands: Both  Permission Sought/Granted Permission sought to share information with : Case Manager, Customer service manager, Family Supports Permission granted to share information with : Yes, Verbal Permission Granted  Share Information with NAME: Bobbie Pinnix  Permission granted to share info w AGENCY: SNF's  Permission granted to share info w Relationship: daughter  Permission granted to  share info w Contact Information: [phone]  Emotional Assessment Appearance:: Appears stated age Attitude/Demeanor/Rapport: Crying   Orientation: :  Fluctuating Orientation (Suspected and/or reported Sundowners) Alcohol / Substance Use: Not Applicable Psych Involvement: No (comment)  Admission diagnosis:  Metabolic encephalopathy [P03.40] Altered mental status [R41.82] Altered mental status, unspecified altered mental status type [R41.82] Senile dementia with behavioral disturbance ([place]) [F03.918] Patient Active Problem List   Diagnosis Date Noted   Hypothyroidism 09/13/2021   CKD (chronic kidney disease) stage 3, GFR 30-59 ml/min ([place])    Pneumonia due to COVID-19 virus 09/23/2019   Hypertension    Sepsis ([place])    UTI (urinary tract infection)    Adrenal adenoma-right    Acute renal failure superimposed on stage 3a chronic kidney disease ([place])    Senile dementia with behavioral disturbance [facility])    PCP:  [facility], [place]:   [place], [place] [address] Phone: [phone] Fax: [place] [address], [place] [address] [place] [address] Phone: [phone] Fax: [phone]     Social Determinants of Health (SDOH) Interventions    Readmission Risk Interventions     View : No data to display.

## 2021-09-15 NOTE — Plan of Care (Signed)
°  Problem: Clinical Measurements: °Goal: Respiratory complications will improve °Outcome: Progressing °  °Problem: Clinical Measurements: °Goal: Cardiovascular complication will be avoided °Outcome: Progressing °  °Problem: Pain Managment: °Goal: General experience of comfort will improve °Outcome: Progressing °  °Problem: Safety: °Goal: Ability to remain free from injury will improve °Outcome: Progressing °  °

## 2021-09-15 NOTE — NC FL2 (Signed)
[place] LEVEL OF CARE SCREENING TOOL     IDENTIFICATION  Patient Name: Victoria Wilkerson Birthdate: [birth year] Sex: female Admission Date (Current Location): 09/13/2021  [facility] and [place] Number:  Engineering geologist and Address:  [facility], [address] [place], [place] [address]      Provider Number: [id number]  Attending Physician Name and Address:  Annita Brod, MD  Relative Name and Phone Number:  Barnetta Chapel- daughter [phone]    Current Level of Care: Hospital Recommended Level of Care: [place] Prior Approval Number:    Date Approved/Denied:   PASRR Number: pending  Discharge Plan: SNF    Current Diagnoses: Patient Active Problem List   Diagnosis Date Noted   Hypothyroidism 09/13/2021   CKD (chronic kidney disease) stage 3, GFR 30-59 ml/min (HCC)    Pneumonia due to COVID-19 virus 09/23/2019   Hypertension    Sepsis ([place])    UTI (urinary tract infection)    Adrenal adenoma-right    Acute renal failure superimposed on stage 3a chronic kidney disease ([place])    Senile dementia with behavioral disturbance (HCC)     Orientation RESPIRATION BLADDER Height & Weight     Self  Normal Continent Weight: 66.6 kg Height:     BEHAVIORAL SYMPTOMS/MOOD NEUROLOGICAL BOWEL NUTRITION STATUS      Continent Diet (Dysphagia diet 2)  AMBULATORY STATUS COMMUNICATION OF NEEDS Skin   Limited Assist Verbally Normal                       Personal Care Assistance Level of Assistance  Bathing, Feeding, Dressing Bathing Assistance: Limited assistance Feeding assistance: Limited assistance Dressing Assistance: Limited assistance     Functional Limitations Info  Sight, Hearing, Speech Sight Info: Adequate Hearing Info: Adequate Speech Info: Adequate    SPECIAL CARE FACTORS FREQUENCY  PT (By licensed PT), OT (By licensed OT)     PT Frequency: 5 times per week OT Frequency: 5 times per week             Contractures Contractures Info: Not present    Additional Factors Info  Code Status, Allergies Code Status Info: DNR Allergies Info: NKA           Current Medications (09/15/2021):  This is the current hospital active medication list Current Facility-Administered Medications  Medication Dose Route Frequency Provider Last Rate Last Admin   acetaminophen (TYLENOL) 160 MG/5ML suspension 480 mg  480 mg Oral TID PRN Pokhrel, Laxman, MD       aspirin chewable tablet 81 mg  81 mg Oral Daily Pokhrel, Laxman, MD   81 mg at 09/15/21 0855   divalproex (DEPAKOTE SPRINKLE) capsule 250 mg  250 mg Oral TID Earlie Counts, NP   250 mg at 09/15/21 1549   heparin injection 5,000 Units  5,000 Units Subcutaneous Q8H Pokhrel, Laxman, MD   5,000 Units at 09/15/21 1549   levothyroxine (SYNTHROID) tablet 50 mcg  50 mcg Oral Daily Pokhrel, Laxman, MD   50 mcg at 09/15/21 0438   LORazepam (ATIVAN) tablet 0.25 mg  0.25 mg Oral Q6H PRN Annita Brod, MD       ondansetron [facility]) tablet 4 mg  4 mg Oral Q6H PRN Pokhrel, Laxman, MD       Or   ondansetron (ZOFRAN) injection 4 mg  4 mg Intravenous Q6H PRN Pokhrel, Laxman, MD       risperiDONE (RISPERDAL) tablet 0.5 mg  0.5  mg Oral QHS Pokhrel, Laxman, MD   0.5 mg at 09/14/21 2015   sulfamethoxazole-trimethoprim (BACTRIM DS) 800-160 MG per tablet 1 tablet  1 tablet Oral Q12H Foust, Katy L, NP         Discharge Medications: Please see discharge summary for a list of discharge medications.  Relevant Imaging Results:  Relevant Lab Results:   Additional Information SS#- [id number]  Shelbie Hutching, RN

## 2021-09-15 NOTE — Progress Notes (Signed)
[facility] Progress Note  Patient: Victoria Wilkerson    YHC:[id number]  DOA: 09/13/2021    Date of Service: the patient was seen and examined on 09/15/2021  Brief hospital course: 76 year old with past medical history of dementia, stage III chronic kidney disease and hypertension from home who goes to daily adult daycare presented to the emergency room on 5/16 with increased confusion felt to be secondary to urinary tract infection.  Treated with IV fluids and antibiotics.  Seen by PT who recommended skilled nursing.  Assessment and Plan: Assessment and Plan: * Senile dementia with behavioral disturbance ([place]) Secondary to urinary tract infection.  Following fluids and antibiotics, now close to baseline.  Continue Risperdal.  Patient more emotionally labile today.  Seen by palliative care and discussion with neurology, increased Depakote to 250 mg 3 times daily.  Also give one-time order for p.o. low-dose Ativan.  UTI (urinary tract infection) Urine cultures growing 80,000 colonies of gram-negative rods.  Currently on IV Rocephin.  Continue to monitor.  Acute renal failure superimposed on stage 3a chronic kidney disease ([place]) Acute kidney injury in the setting of stage IIIa chronic kidney disease.  Presented with creatinine of 1.48 and GFR 37.  With fluids, creatinine down to 1.04 with GFR 57.  Hypertension Blood pressure stable.  Continue home medications.  Hypothyroidism Continue Synthroid.       Body mass index is 27.74 kg/m.        Consultants: Palliative care  Procedures: EEG pending  Antimicrobials: IV Rocephin 5/16-present  Code Status: DNR   Subjective: Currently calm, not fully asleep  Objective: Vital signs were reviewed and unremarkable. Vitals:   09/15/21 0446 09/15/21 0854  BP: 135/74 113/61  Pulse: (!) 107 92  Resp:  18  Temp: (!) 97.5 F (36.4 C) 97.8 F (36.6 C)  SpO2: 99% 100%    Intake/Output Summary (Last 24 hours) at 09/15/2021  1351 Last data filed at 09/15/2021 1325 Gross per 24 hour  Intake 510.39 ml  Output 5 ml  Net 505.39 ml    Filed Weights   09/14/21 2330  Weight: 66.6 kg   Body mass index is 27.74 kg/m.  Exam:  General: Quiet, appears to be in no acute distress HEENT: Normocephalic, atraumatic, mucous membranes slightly dry Cardiovascular: Regular rate and rhythm, S1-S2 Respiratory: Clear to auscultation bilaterally Abdomen: Soft, nondistended, nontender, hypoactive bowel sounds Musculoskeletal: No clubbing or cyanosis or edema Skin: No skin breaks, tears or lesions Psychiatry: Underlying dementia and confusion, but no acute psychosis Neurology: No focal deficits  Data Reviewed: Creatinine with slight improvement.  Disposition:  Status is: Inpatient  Anticipated discharge date: 5/19, when authorized for acute skilled nursing rehab   Family Communication: Left message with family DVT Prophylaxis: heparin injection 5,000 Units Start: 09/13/21 2200    Author: Annita Brod ,MD 09/15/2021 1:51 PM  To reach On-call, see care teams to locate the attending and reach out via [email or web address]. Between 7PM-7AM, please contact night-coverage If you still have difficulty reaching the attending provider, please page the [facility] (Director on Call) for [facility] on amion for assistance.

## 2021-09-15 NOTE — Progress Notes (Signed)
Pt refused heparin subcutaneous injection but was educated abouts its importance.

## 2021-09-15 NOTE — Progress Notes (Signed)
Daily Progress Note   Patient Name: Victoria Wilkerson       Date: 09/15/2021 DOB: November 29, 1945  Age: 76 y.o. MRN#: [id number] Attending Physician: Annita Brod, MD Primary Care Physician: [facility], [place] Date: 09/13/2021  Reason for Consultation/Follow-up: Establishing goals of care  Patient Profile/HPI:   76 y.o. female  with past medical history of dementia with behavioral disturbance, CKD, HTN, TIA's admitted on 09/13/2021 with altered mental status, confusion, disorientation increased from her baseline. Workup reveals UTI. Palliative medicine consulted for [place].    Subjective: Patient's daughters yesterday discussed patient's constant crying. She is ambulating with one person assist.  Today patient continues with emotional lability- she is crying inconsolably nonstop. I was able to reach Luella Cook, PA at the [facility]. She recommended increasing patient's depakote to '250mg'$  TID.  I called Eleanora Neighbor is in agreement with this plan.  Goals continue to be to stabilize and discharge to SNF.   Review of Systems  Unable to perform ROS: Dementia    Physical Exam Vitals and nursing note reviewed.  Constitutional:      Comments: frail  Skin:    General: Skin is warm and dry.  Neurological:     Mental Status: She is alert. She is disoriented.  Psychiatric:     Comments: Emotionally labile            Vital Signs: BP 113/61 (BP Location: Left Arm)   Pulse 92   Temp 97.8 F (36.6 C)   Resp 18   Wt 66.6 kg   SpO2 100%   BMI 27.74 kg/m  SpO2: SpO2: 100 % O2 Device: O2 Device: Room Air O2 Flow Rate:    Intake/output summary:  Intake/Output Summary (Last 24 hours) at 09/15/2021 1102 Last data filed at 09/15/2021 0446 Gross per 24 hour   Intake 510.39 ml  Output 1 ml  Net 509.39 ml   LBM: Last BM Date :  (unknown) Baseline Weight: Weight: 66.6 kg Most recent weight: Weight: 66.6 kg       Palliative Assessment/Data: PPS: 50%      Patient Active Problem List   Diagnosis Date Noted   Hypothyroidism 09/13/2021   CKD (chronic kidney disease) stage 3, GFR 30-59 ml/min (HCC)    Pneumonia due to COVID-19 virus 09/23/2019  Hypertension    Sepsis ([place])    UTI (urinary tract infection)    Adrenal adenoma-right    Acute renal failure superimposed on stage 3a chronic kidney disease (HCC)    Senile dementia with behavioral disturbance [facility])     Palliative Care Assessment & Plan    Assessment/Recommendations/Plan  Advanced dementia- ?psuedobulbar affect- depakote increased to '250mg'$  TID per discussion with Neurology- they will schedule her to be seen soon after discharge [place]- continue treatment in efforts to stabilize and discharge to SNF- recommend outpatient Palliative continue to follow at discharge   Code Status: DNR  Prognosis:  Unable to determine  Discharge Planning: [place] for rehab with Palliative care service follow-up  Care plan was discussed with patient's family and care team.   Thank you for allowing the Palliative Medicine Team to assist in the care of this patient.  Total time: 60 mins      Greater than 50%  of this time was spent counseling and coordinating care related to the above assessment and plan.  Mariana Kaufman, AGNP-C Palliative Medicine   Please contact Palliative Medicine Team phone at [phone] for questions and concerns.

## 2021-09-15 NOTE — Procedures (Signed)
History: 76 yo F with encephalopathy  Sedation: None  Technique: This EEG was acquired with electrodes placed according to the International 10-20 electrode system (including Fp1, Fp2, F3, F4, C3, C4, P3, P4, O1, O2, T3, T4, T5, T6, A1, A2, Fz, Cz, Pz). The following electrodes were missing or displaced: none.   Background: The posterior dominant rhythm of 8-9 Hz is poorly sustained and poorly formed. There is superimposed low voltage irregualr delta and theta range activity that intrudes into the background. Much of the recording is obscured by muscle artifact.   Photic stimulation: Physiologic driving is not performed  EEG Abnormalities: 1) Generalized irregualr slow activity  Clinical Interpretation: This EEG is consistent with a generalized non-specific cerebral dysfunction(encephalopathy) . There was no seizure or seizure predisposition recorded on this study. Please note that lack of epileptiform activity on EEG does not preclude the possibility of epilepsy.   Roland Rack, MD [facility] [phone]  If 7pm- 7am, please page neurology on call as listed in [place].

## 2021-09-16 DIAGNOSIS — N3000 Acute cystitis without hematuria: Secondary | ICD-10-CM | POA: Diagnosis not present

## 2021-09-16 DIAGNOSIS — E039 Hypothyroidism, unspecified: Secondary | ICD-10-CM | POA: Diagnosis not present

## 2021-09-16 DIAGNOSIS — N179 Acute kidney failure, unspecified: Secondary | ICD-10-CM | POA: Diagnosis not present

## 2021-09-16 DIAGNOSIS — F03918 Unspecified dementia, unspecified severity, with other behavioral disturbance: Secondary | ICD-10-CM | POA: Diagnosis not present

## 2021-09-16 MED ORDER — SULFAMETHOXAZOLE-TRIMETHOPRIM 800-160 MG PO TABS: 1.0000 | ORAL_TABLET | Freq: Two times a day (BID) | ORAL | 0 refills | Status: DC

## 2021-09-16 MED ORDER — DIVALPROEX SODIUM 125 MG PO CSDR: 250.0000 mg | DELAYED_RELEASE_CAPSULE | Freq: Three times a day (TID) | ORAL | 1 refills | Status: AC

## 2021-09-16 MED ORDER — LORAZEPAM 0.5 MG PO TABS: 0.5000 mg | ORAL_TABLET | Freq: Four times a day (QID) | ORAL | Status: DC

## 2021-09-16 MED ORDER — LORAZEPAM 0.5 MG PO TABS: 0.2500 mg | ORAL_TABLET | Freq: Four times a day (QID) | ORAL | 0 refills | Status: DC

## 2021-09-16 MED ORDER — CIPROFLOXACIN HCL 500 MG PO TABS: 250.0000 mg | ORAL_TABLET | Freq: Two times a day (BID) | ORAL | 0 refills | Status: AC

## 2021-09-16 MED ORDER — LORAZEPAM 0.5 MG PO TABS: 0.2500 mg | ORAL_TABLET | Freq: Four times a day (QID) | ORAL | Status: DC

## 2021-09-16 NOTE — Progress Notes (Signed)
Physical Therapy Treatment Patient Details Name: Victoria Wilkerson MRN: [id number] DOB: Aug 31, 1945 Today's Date: 09/16/2021   History of Present Illness Pt is a 76 years old female with past medical history of worsening Dementia with history of hallucinations and agitation during adult daycare, CKD stage III, hypertension, history of TIA presented to hospital with altered mental status confusion and disorientation more so than her baseline. Diagnosed with UTI    PT Comments    Patient received in bed, tearful. Agrees to PT session. States she is cold. Patient requires cues for bed mobility, she is able to perform mostly on her own only needing min A to bring legs back up onto bed. Transfers and ambulated with min A ( hand held). Slow steady pace with ambulation. She will continue to benefit from skilled PT to maximize independence and strength with mobility for fall prevention.     Recommendations for follow up therapy are one component of a multi-disciplinary discharge planning process, led by the attending physician.  Recommendations may be updated based on patient status, additional functional criteria and insurance authorization.  Follow Up Recommendations  Skilled nursing-short term rehab (<3 hours/day)     Assistance Recommended at Discharge Frequent or constant Supervision/Assistance  Patient can return home with the following A little help with bathing/dressing/bathroom;Assist for transportation;Help with stairs or ramp for entrance;A little help with walking and/or transfers;Direct supervision/assist for financial management;Assistance with cooking/housework;Direct supervision/assist for medications management   Equipment Recommendations  Rolling walker (2 wheels)    Recommendations for Other Services       Precautions / Restrictions Precautions Precautions: Fall Restrictions Weight Bearing Restrictions: No     Mobility  Bed Mobility Overal bed mobility: Needs Assistance Bed  Mobility: Supine to Sit, Sit to Supine     Supine to sit: Modified independent (Device/Increase time) Sit to supine: Min assist   General bed mobility comments: Min A to bring LEs up onto bed    Transfers Overall transfer level: Needs assistance Equipment used: 1 person hand held assist Transfers: Sit to/from Stand Sit to Stand: Min guard                Ambulation/Gait Ambulation/Gait assistance: Min guard Gait Distance (Feet): 75 Feet Assistive device: 1 person hand held assist Gait Pattern/deviations: Step-through pattern, Decreased step length - right, Decreased step length - left, Decreased stride length, Narrow base of support Gait velocity: decr     General Gait Details: ambulated with single UE support. Steady, slow pace. Cues for direction needed   Stairs             Wheelchair Mobility    Modified Rankin (Stroke Patients Only)       Balance Overall balance assessment: Needs assistance Sitting-balance support: Feet supported Sitting balance-Leahy Scale: Good     Standing balance support: Single extremity supported, During functional activity Standing balance-Leahy Scale: Good                              Cognition Arousal/Alertness: Awake/alert Behavior During Therapy: Flat affect (labile) Overall Cognitive Status: No family/caregiver present to determine baseline cognitive functioning Area of Impairment: Memory, Safety/judgement, Awareness, Problem solving, Orientation                 Orientation Level: Disoriented to, Place, Time, Situation Current Attention Level: Sustained Memory: Decreased short-term memory Following Commands: Follows one step commands with increased time Safety/Judgement: Decreased awareness of safety, Decreased awareness of deficits  Awareness: Intellectual Problem Solving: Requires verbal cues, Requires tactile cues, Slow processing          Exercises      General Comments         Pertinent Vitals/Pain Pain Assessment Pain Assessment: Faces Faces Pain Scale: Hurts a little bit Pain Location: R LE during ambulation Pain Descriptors / Indicators: Discomfort    Home Living                          Prior Function            PT Goals (current goals can now be found in the care plan section) Acute Rehab PT Goals Patient Stated Goal: to get stronger and reduce caregiver needs PT Goal Formulation: With family Time For Goal Achievement: 09/28/21 Potential to Achieve Goals: Good Progress towards PT goals: Progressing toward goals    Frequency    Min 2X/week      PT Plan Current plan remains appropriate    Co-evaluation              AM-PAC PT "6 Clicks" Mobility   Outcome Measure  Help needed turning from your back to your side while in a flat bed without using bedrails?: A Little Help needed moving from lying on your back to sitting on the side of a flat bed without using bedrails?: A Little Help needed moving to and from a bed to a chair (including a wheelchair)?: A Little Help needed standing up from a chair using your arms (e.g., wheelchair or bedside chair)?: A Little Help needed to walk in hospital room?: A Little Help needed climbing 3-5 steps with a railing? : A Lot 6 Click Score: 17    End of Session Equipment Utilized During Treatment: Gait belt Activity Tolerance: Patient tolerated treatment well Patient left: in bed;with bed alarm set Nurse Communication: Mobility status PT Visit Diagnosis: Other abnormalities of gait and mobility (R26.89);Difficulty in walking, not elsewhere classified (R26.2);Muscle weakness (generalized) (M62.81)     Time: 1219-7588 PT Time Calculation (min) (ACUTE ONLY): 14 min  Charges:  $Gait Training: 8-22 mins                     Sincere Liuzzi, PT, GCS 09/16/21,11:40 AM

## 2021-09-16 NOTE — Care Management Important Message (Signed)
Important Message  Patient Details  Name: Prescilla Dendinger MRN: [id number] Date of Birth: [birth year]   Medicare Important Message Given:  N/A - LOS <3 / Initial given by admissions     Juliann Pulse A Benjiman Sedgwick 09/16/2021, 8:22 AM

## 2021-09-16 NOTE — Progress Notes (Addendum)
To whom it may concern:  The above named patient has a diagnosis of dementia which supercedes any psychiatric diagnosis.

## 2021-09-16 NOTE — Progress Notes (Signed)
To whom it may concern:  The above named patient requires a short term nursing home stay- 30 days or less - for strengthening and rehabilitation.  The plan is for return home.

## 2021-09-16 NOTE — TOC Progression Note (Signed)
Transition of Care [facility]) - Progression Note    Patient Details  Name: Natalynn Ledgerwood MRN: [id number] Date of Birth: Apr 18, 1946  Transition [facility]) CM/SW Contact  Shelbie Hutching, RN Phone Number: 09/16/2021, 10:14 AM  Clinical Narrative:    [facility] has offered a bed, bed offer accepted.  Pasrr pending.    Expected Discharge Plan: [place] Barriers to Discharge: Continued Medical Work up  Expected Discharge Plan and Services Expected Discharge Plan: [place]   Discharge Planning Services: CM Consult Post Acute Care Choice: [place] Living arrangements for the past 2 months: Apartment                 DME Arranged: N/A DME Agency: NA       HH Arranged: NA HH Agency: NA         Social Determinants of Health (SDOH) Interventions    Readmission Risk Interventions     View : No data to display.

## 2021-09-16 NOTE — TOC Transition Note (Signed)
Transition of Care [facility]) - CM/SW Discharge Note   Patient Details  Name: Victoria Wilkerson MRN: [id number] Date of Birth: August 22, 1945  Transition of Care [facility]) CM/SW Contact:  Shelbie Hutching, RN Phone Number: 09/16/2021, 1:44 PM   Clinical Narrative:    Patient is medically cleared for discharge to [facility] today.  Daughter at the bedside and updated on discharge for today.  Patient will be going to room 300 B, bedside RN will call report to [phone].  RNCM will call [facility] for transport.  Pasrr [phone] [id number].   Final next level of care: Skilled Nursing Facility Barriers to Discharge: Barriers Resolved   Patient Goals and CMS Choice Patient states their goals for this hospitalization and ongoing recovery are:: family wants rehab and then transition to LTC CMS Medicare.gov Compare Post Acute Care list provided to:: Patient Represenative (must comment) Choice offered to / list presented to : Adult Children  Discharge Placement PASRR number recieved: 09/16/21            Patient chooses bed at: [facility] Patient to be transferred to facility by: [facility] Name of family member notified: [name] Patient and family notified of of transfer: 09/16/21  Discharge Plan and Services   Discharge Planning Services: CM Consult Post Acute Care Choice: [place]          DME Arranged: N/A DME Agency: NA       HH Arranged: NA HH Agency: NA        Social Determinants of Health (SDOH) Interventions     Readmission Risk Interventions     View : No data to display.

## 2021-09-16 NOTE — Discharge Summary (Signed)
Physician Discharge Summary   Patient: Victoria Wilkerson MRN: [id number] DOB: Oct 03, 1945  Admit date:     09/13/2021  Discharge date: 09/16/21  Discharge Physician: Annita Brod   PCP: [facility] [place]   Recommendations at discharge:   New medication: Cipro 250 mg p.o. twice daily x3 days Medication change: Losartan/HCTZ discontinued New medication: Ativan 0.25 mg p.o. every 6 hours Medication change: Depakote sprinkles increased to 250 mg p.o. 3 times daily  Discharge Diagnoses: Principal Problem:   Senile dementia with behavioral disturbance ([place]) Active Problems:   UTI (urinary tract infection)   Acute renal failure superimposed on stage 3a chronic kidney disease ([place])   Hypertension   Hypothyroidism  Resolved Problems:   * No resolved hospital problems. [place] Hospital Course: 76 year old with past medical history of dementia, stage III chronic kidney disease and hypertension from home who goes to daily adult daycare presented to the emergency room on 5/16 with increased confusion felt to be secondary to urinary tract infection.  Treated with IV fluids and antibiotics.  Seen by PT who recommended skilled nursing.  Assessment and Plan: * Senile dementia with behavioral disturbance ([place]) Secondary to urinary tract infection.  Following fluids and antibiotics, now close to baseline.  Continue Risperdal.  Patient more emotionally labile today.  Seen by palliative care and discussion with neurology, increased Depakote to 250 mg 3 times daily.  Patient still quite easily tearful and have started very low-dose Ativan, 0.25 mg every 6 hours which has seemed to help.  Incidentally EEG done on admission to rule out seizure and it was negative.  UTI (urinary tract infection) Urine cultures grew out 100,000 colonies of penicillin resistant E. coli, but otherwise pansensitive.  Patient changed over from IV Rocephin to p.o. Bactrim.  However, Bactrim with much lower  sensitivities than the Cipro.  So we will discharge on p.o. Cipro until 5/22 to complete a full 7-day course.  Acute renal failure superimposed on stage 3a chronic kidney disease ([place]) Acute kidney injury in the setting of stage IIIa chronic kidney disease.  Presented with creatinine of 1.48 and GFR 37.  With fluids, creatinine down to 1.04 with GFR 57 by day before discharge.  Hypertension Blood pressure has been stable during hospitalization off of her home medications.  Given the acute kidney injury, will discontinue her ARB combo medication for now.  Consider restarting losartan by itself should blood pressure start to trend upward.  Hypothyroidism Continue Synthroid.         Consultants: None Procedures performed: EEG: Unremarkable for seizure activity Disposition: Skilled nursing facility Diet recommendation:  Discharge Diet Orders (From admission, onward)     Start     Ordered   09/16/21 0000  DIET DYS 2       Question:  Fluid consistency:  Answer:  Thin   09/16/21 1256           Diet: Dysphagia 2 diet with thin liquids  DISCHARGE MEDICATION: Allergies as of 09/16/2021   No Known Allergies      Medication List     STOP taking these medications    divalproex 125 MG DR tablet Commonly known as: DEPAKOTE Replaced by: divalproex 125 MG capsule   losartan-hydrochlorothiazide 100-25 MG tablet Commonly known as: HYZAAR       TAKE these medications    acetaminophen 160 mg/5 mL Soln Commonly known as: TYLENOL Take 15 mLs by mouth 3 (three) times daily as needed.   aspirin 81 MG chewable  tablet Chew 81 mg by mouth daily.   ciprofloxacin 500 MG tablet Commonly known as: Cipro Take 0.5 tablets (250 mg total) by mouth 2 (two) times daily for 3 days.   divalproex 125 MG capsule Commonly known as: DEPAKOTE SPRINKLE Take 2 capsules (250 mg total) by mouth 3 (three) times daily. Replaces: divalproex 125 MG DR tablet   levothyroxine 50 MCG  tablet Commonly known as: SYNTHROID Take 50 mcg by mouth daily.   LORazepam 0.5 MG tablet Commonly known as: ATIVAN Take 0.5 tablets (0.25 mg total) by mouth every 6 (six) hours.   risperiDONE 0.5 MG tablet Commonly known as: RISPERDAL Take 0.5 mg by mouth at bedtime.        Contact information for after-discharge care     Destination     [facility] .   Service: Skilled Nursing Contact information: [address] [place] [phone]                    Discharge Exam: Danley Danker Weights   09/14/21 2330  Weight: 66.6 kg   General: Alert, oriented x1 Cardiovascular: Regular rate and rhythm, S1-S2 Lungs: Clear to auscultation bilaterally  Condition at discharge: fair  The results of significant diagnostics from this hospitalization (including imaging, microbiology, ancillary and laboratory) are listed below for reference.   Imaging Studies: CT Head Wo Contrast  Result Date: 09/13/2021 CLINICAL DATA:  Altered mental status, history of dementia EXAM: CT HEAD WITHOUT CONTRAST TECHNIQUE: Contiguous axial images were obtained from the base of the skull through the vertex without intravenous contrast. RADIATION DOSE REDUCTION: This exam was performed according to the departmental dose-optimization program which includes automated exposure control, adjustment of the mA and/or kV according to patient size and/or use of iterative reconstruction technique. COMPARISON:  No prior CT correlation is made with 03/30/2017 MRI FINDINGS: Brain: Hypodensity in the left occipital lobe, which correlates with an area of encephalomalacia and gliosis on the 2018 MRI. 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 No new hypodensity to suggest acute infarct. No evidence of acute hemorrhage, cerebral edema, mass, mass effect, or midline shift. No hydrocephalus or extra-axial fluid  collection. Periventricular white matter changes, likely the sequela of chronic small vessel ischemic disease. Degree of cerebral volume loss is somewhat advanced for age. Vascular: No hyperdense vessel. Atherosclerotic calcifications in the intracranial carotid and vertebral arteries. Skull: Normal. Negative for fracture or focal lesion. Sinuses/Orbits: No acute finding. Other: The mastoid air cells are well aerated. IMPRESSION: No acute intracranial process. Electronically Signed   By: Merilyn Baba M.D.   On: 09/13/2021 15:01   DG Chest Portable 1 View  Result Date: 09/13/2021 CLINICAL DATA:  Altered mental status.  Dementia. EXAM: PORTABLE CHEST 1 VIEW COMPARISON:  09/23/2019 FINDINGS: Atherosclerotic calcification of the aortic arch. Heart size within normal limits. Thoracic spondylosis. Linear opacity in the right mid lung potentially reflecting subsegmental atelectasis. Lungs appear otherwise clear. No blunting of the costophrenic angles. IMPRESSION: 1. Potential subsegmental atelectasis in the right mid lung. The lungs appear otherwise clear. 2.  Aortic Atherosclerosis (ICD10-I70.0). Electronically Signed   By: Van Clines M.D.   On: 09/13/2021 15:12   EEG adult  Result Date: 09/14/2021 Greta Doom, MD     09/15/2021  4:11 PM History: 75 yo F with encephalopathy Sedation: None Technique: This EEG was acquired with electrodes placed according to the International 10-20  electrode system (including Fp1, Fp2, F3, F4, C3, C4, P3, P4, O1, O2, T3, T4, T5, T6, A1, A2, Fz, Cz, Pz). The following electrodes were missing or displaced: none. Background: The posterior dominant rhythm of 8-9 Hz is poorly sustained and poorly formed. There is superimposed low voltage irregualr delta and theta range activity that intrudes into the background. Much of the recording is obscured by muscle artifact. Photic stimulation: Physiologic driving is not performed EEG Abnormalities: 1) Generalized irregualr slow  activity Clinical Interpretation: This EEG is consistent with a generalized non-specific cerebral dysfunction(encephalopathy) . There was no seizure or seizure predisposition recorded on this study. Please note that lack of epileptiform activity on EEG does not preclude the possibility of epilepsy. Roland Rack, MD [facility] [phone] If 7pm- 7am, please page neurology on call as listed in [place].    Microbiology: Results for orders placed or performed during the hospital encounter of 09/13/21  Urine Culture     Status: Abnormal   Collection Time: 09/13/21  5:32 PM   Specimen: Urine, Random  Result Value Ref Range Status   Specimen Description   Final    URINE, RANDOM Performed at [facility], [address] [place], [place] [address]    Special Requests   Final    NONE Performed at [facility], [address] [place], [place] [address]    Culture >=100,000 COLONIES/mL ESCHERICHIA COLI (A)  Final   Report Status 09/15/2021 FINAL  Final   Organism ID, Bacteria ESCHERICHIA COLI (A)  Final      Susceptibility   Escherichia coli - MIC*    AMPICILLIN >=32 RESISTANT Resistant     CEFAZOLIN <=4 SENSITIVE Sensitive     CEFEPIME <=0.12 SENSITIVE Sensitive     CEFTRIAXONE <=0.25 SENSITIVE Sensitive     CIPROFLOXACIN <=0.25 SENSITIVE Sensitive     GENTAMICIN <=1 SENSITIVE Sensitive     IMIPENEM <=0.25 SENSITIVE Sensitive     NITROFURANTOIN <=16 SENSITIVE Sensitive     TRIMETH/SULFA <=20 SENSITIVE Sensitive     AMPICILLIN/SULBACTAM >=32 RESISTANT Resistant     PIP/TAZO <=4 SENSITIVE Sensitive     * >=100,000 COLONIES/mL ESCHERICHIA COLI    Labs: CBC: Recent Labs  Lab 09/13/21 1421 09/14/21 0406  WBC 5.2 4.8  NEUTROABS 4.3  --   HGB 13.1 12.0  HCT 41.2 37.2  MCV 92.4 90.1  PLT 324 093   Basic Metabolic Panel: Recent Labs  Lab 09/13/21 1421 09/14/21 0406 09/15/21 0428  NA 134* 137 137  K 3.6 3.1* 3.7  CL 97* 101 101  CO2 '25 26  27  '$ GLUCOSE 115* 102* 92  BUN 25* 23 26*  CREATININE 1.48* 1.12* 1.04*  CALCIUM 9.7 9.4 9.1  MG  --  1.9  --    Liver Function Tests: Recent Labs  Lab 09/13/21 1421  AST 34  ALT 19  ALKPHOS 61  BILITOT 0.6  PROT 7.9  ALBUMIN 3.6   CBG: No results for input(s): GLUCAP in the last 168 hours.  Discharge time spent: greater than 30 minutes.  Signed: Annita Brod, MD [facility] 09/16/2021

## 2021-09-16 NOTE — Progress Notes (Signed)
Pt refused to get weights. Will notify incoming shift. Will continue to monitor.

## 2021-09-23 LAB — BLOOD GAS, VENOUS
Acid-Base Excess: 6.2 mmol/L — ABNORMAL HIGH (ref 0.0–2.0)
Bicarbonate: 32.7 mmol/L — ABNORMAL HIGH (ref 20.0–28.0)
O2 Saturation: 35.3 %
Patient temperature: 37
pCO2, Ven: 54 mmHg (ref 44–60)
pH, Ven: 7.39 (ref 7.25–7.43)

## 2021-09-27 ENCOUNTER — Other Ambulatory Visit: Payer: Self-pay

## 2021-09-27 ENCOUNTER — Emergency Department
Admission: EM | Admit: 2021-09-27 | Discharge: 2021-09-29 | Disposition: A | Discharge status: Home / Self Care | Payer: Medicare Other | Attending: Student in an Organized Health Care Education/Training Program | Admitting: Student in an Organized Health Care Education/Training Program

## 2021-09-27 DIAGNOSIS — Z20822 Contact with and (suspected) exposure to covid-19: Secondary | ICD-10-CM | POA: Diagnosis not present

## 2021-09-27 DIAGNOSIS — N390 Urinary tract infection, site not specified: Secondary | ICD-10-CM | POA: Insufficient documentation

## 2021-09-27 DIAGNOSIS — Z79899 Other long term (current) drug therapy: Secondary | ICD-10-CM | POA: Diagnosis not present

## 2021-09-27 DIAGNOSIS — F03918 Unspecified dementia, unspecified severity, with other behavioral disturbance: Secondary | ICD-10-CM | POA: Insufficient documentation

## 2021-09-27 DIAGNOSIS — R456 Violent behavior: Secondary | ICD-10-CM | POA: Diagnosis present

## 2021-09-27 DIAGNOSIS — R4689 Other symptoms and signs involving appearance and behavior: Secondary | ICD-10-CM

## 2021-09-27 DIAGNOSIS — Z043 Encounter for examination and observation following other accident: Secondary | ICD-10-CM | POA: Diagnosis present

## 2021-09-27 LAB — CBC WITH DIFFERENTIAL/PLATELET
Abs Immature Granulocytes: 0.04 10*3/uL (ref 0.00–0.07)
Basophils Absolute: 0.1 10*3/uL (ref 0.0–0.1)
Basophils Relative: 1 %
Eosinophils Absolute: 0.1 10*3/uL (ref 0.0–0.5)
Eosinophils Relative: 2 %
HCT: 37 % (ref 36.0–46.0)
Hemoglobin: 11.5 g/dL — ABNORMAL LOW (ref 12.0–15.0)
Immature Granulocytes: 1 %
Lymphocytes Relative: 34 %
Lymphs Abs: 1.9 10*3/uL (ref 0.7–4.0)
MCH: 30.1 pg (ref 26.0–34.0)
MCHC: 31.1 g/dL (ref 30.0–36.0)
MCV: 96.9 fL (ref 80.0–100.0)
Monocytes Absolute: 0.4 10*3/uL (ref 0.1–1.0)
Monocytes Relative: 7 %
Neutro Abs: 3 10*3/uL (ref 1.7–7.7)
Neutrophils Relative %: 55 %
Platelets: 372 10*3/uL (ref 150–400)
RBC: 3.82 MIL/uL — ABNORMAL LOW (ref 3.87–5.11)
RDW: 14.1 % (ref 11.5–15.5)
WBC: 5.5 10*3/uL (ref 4.0–10.5)
nRBC: 0 % (ref 0.0–0.2)

## 2021-09-27 LAB — BASIC METABOLIC PANEL
Anion gap: 7 (ref 5–15)
BUN: 22 mg/dL (ref 8–23)
CO2: 24 mmol/L (ref 22–32)
Calcium: 9 mg/dL (ref 8.9–10.3)
Chloride: 108 mmol/L (ref 98–111)
Creatinine, Ser: 1.23 mg/dL — ABNORMAL HIGH (ref 0.44–1.00)
GFR, Estimated: 46 mL/min — ABNORMAL LOW (ref >60)
Glucose, Bld: 104 mg/dL — ABNORMAL HIGH (ref 70–99)
Potassium: 3.9 mmol/L (ref 3.5–5.1)
Sodium: 139 mmol/L (ref 135–145)

## 2021-09-27 LAB — VALPROIC ACID LEVEL: Valproic Acid Lvl: 61 ug/mL (ref 50.0–100.0)
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
## 2021-09-27 NOTE — ED Notes (Signed)
Pt was dressed out into blue pants and wine color scrubs. Belongings are  Black shoes Black socks [employer]

## 2021-09-28 DIAGNOSIS — F03918 Unspecified dementia, unspecified severity, with other behavioral disturbance: Secondary | ICD-10-CM

## 2021-09-28 LAB — URINALYSIS, ROUTINE W REFLEX MICROSCOPIC
Bacteria, UA: NONE SEEN
Bilirubin Urine: NEGATIVE
Glucose, UA: NEGATIVE mg/dL
Hgb urine dipstick: NEGATIVE
Ketones, ur: NEGATIVE mg/dL
Nitrite: NEGATIVE
Protein, ur: NEGATIVE mg/dL
Specific Gravity, Urine: 1.012 (ref 1.005–1.030)
pH: 7 (ref 5.0–8.0)

## 2021-09-28 LAB — RESP PANEL BY RT-PCR (FLU A&B, COVID) ARPGX2
Influenza A by PCR: NEGATIVE
Influenza B by PCR: NEGATIVE
SARS Coronavirus 2 by RT PCR: NEGATIVE

## 2021-09-28 MED ORDER — RISPERIDONE 1 MG PO TABS
0.5000 mg | ORAL_TABLET | Freq: Every day | ORAL | Status: DC
  Administered 2021-09-28: 0.5 mg via ORAL
  Filled 2021-09-28: qty 1

## 2021-09-28 MED ORDER — LORAZEPAM 0.5 MG PO TABS
0.2500 mg | ORAL_TABLET | Freq: Four times a day (QID) | ORAL | Status: DC
  Administered 2021-09-28 – 2021-09-29 (×3): 0.25 mg via ORAL
  Filled 2021-09-28 (×3): qty 1

## 2021-09-28 MED ORDER — FOSFOMYCIN TROMETHAMINE 3 G PO PACK
3.0000 g | PACK | Freq: Once | ORAL | Status: AC
  Administered 2021-09-28: 3 g via ORAL
  Filled 2021-09-28: qty 3

## 2021-09-28 MED ORDER — LEVOTHYROXINE SODIUM 50 MCG PO TABS
50.0000 ug | ORAL_TABLET | Freq: Every day | ORAL | Status: DC
  Administered 2021-09-29: 50 ug via ORAL
  Filled 2021-09-28: qty 1

## 2021-09-28 MED ORDER — RISPERIDONE 1 MG PO TABS: 0.5000 mg | ORAL_TABLET | Freq: Two times a day (BID) | ORAL | Status: DC

## 2021-09-28 MED ORDER — DIVALPROEX SODIUM 125 MG PO CSDR
250.0000 mg | DELAYED_RELEASE_CAPSULE | Freq: Three times a day (TID) | ORAL | Status: DC
Start: 2021-09-28 — End: 2021-09-29
  Administered 2021-09-28 (×2): 250 mg via ORAL
  Filled 2021-09-28 (×5): qty 2

## 2021-09-28 NOTE — ED Notes (Signed)
Pt resting comfortably, breathing easy and unlabored, no acute distress, will continue to monitor.

## 2021-09-28 NOTE — ED Notes (Signed)
Pt resting at this time. Pt attempting to get out of bed, sitter at bedside.

## 2021-09-28 NOTE — ED Notes (Signed)
Pt awake now, Daughter at bedside , updated family with plan of care for pt

## 2021-09-28 NOTE — ED Provider Notes (Signed)
Ongoing ED care assigned to Dr. Archie Balboa.  Follow-up on urinalysis which is pending.  Patient was recently admitted for behavioral changes, urinary tract infection.  Wish to ensure no evidence of ongoing UTI.  If urinalysis without obvious infection, would anticipate likely discharge back to her nursing facility unless facility refuses.... In which case TOC consult may be necessary   Delman Kitten, MD 09/28/21 1549

## 2021-09-28 NOTE — Progress Notes (Signed)
The patient was given IM for increased agitation. The psych team (TTS/NP) is unable to assess the patient currently. We will continue to attempt to get the patient assessment done once she can entirely participate in the assessment process.

## 2021-09-28 NOTE — ED Notes (Signed)
Pt attempted to get out of bed numerous times and had to be redirected back to bed. Spoke to the pt's POA regarding the reason the Nursing home sent the pt over, Pt's POA stated the pt has never had this issue before.

## 2021-09-28 NOTE — ED Notes (Signed)
Pt cleaned of stool and urine at this time.  Clean brief placed on pt and external female catheter applied.  NAD noted.  Pt in ED room 2 at this time.  EDT John sitting in room with pt for safety,

## 2021-09-28 NOTE — Consult Note (Signed)
[facility] Consult   Reason for Consult:  agitation Referring Physician:  EDP Patient Identification: Victoria Wilkerson MRN:  [id number] Principal Diagnosis: UTI Diagnosis:  Active Problems:   Dementia with behavioral disturbance [facility])   Total Time spent with patient: 45 minutes  Subjective:   Victoria Wilkerson is a 76 y.o. female patient admitted with agitation and UTI.  HPI:  76 yo female admitted from her SNF after becoming combative, diagnosed in the ED with a UTI.  On assessment, she is calm and cooperative with no suicidal/homicidal ideations, hallucinations, or substance abuse. Her behaviors were most likely related to her UTI, psych cleared.  Past Psychiatric History: dementia  Risk to Self:  none Risk to Others:  none Prior Inpatient Therapy:  none Prior Outpatient Therapy:  unknown  Past Medical History:  Past Medical History:  Diagnosis Date   CKD (chronic kidney disease) stage 3, GFR 30-59 ml/min (HCC)    Hypertension    Mild dementia ([place])    TIA (transient ischemic attack)     Past Surgical History:  Procedure Laterality Date   CARPAL TUNNEL RELEASE     Family History: History reviewed. No pertinent family history. Family Psychiatric  History: none Social History:  Social History   Substance and Sexual Activity  Alcohol Use Never     Social History   Substance and Sexual Activity  Drug Use Never    Social History   Socioeconomic History   Marital status: Divorced    Spouse name: Not on file   Number of children: Not on file   Years of education: Not on file   Highest education level: Not on file  Occupational History   Not on file  Tobacco Use   Smoking status: Never   Smokeless tobacco: Never  Substance and Sexual Activity   Alcohol use: Never   Drug use: Never   Sexual activity: Not on file  Other Topics Concern   Not on file  Social History Narrative   Not on file   Social Determinants of Health   Financial Resource  Strain: Not on file  Food Insecurity: Not on file  Transportation Needs: Not on file  Physical Activity: Not on file  Stress: Not on file  Social Connections: Not on file   Additional Social History:    Allergies:  No Known Allergies  Labs:  Results for orders placed or performed during the hospital encounter of 09/27/21 (from the past 48 hour(s))  CBC with Differential     Status: Abnormal   Collection Time: 09/27/21 10:19 PM  Result Value Ref Range   WBC 5.5 4.0 - 10.5 K/uL   RBC 3.82 (L) 3.87 - 5.11 MIL/uL   Hemoglobin 11.5 (L) 12.0 - 15.0 g/dL   HCT 37.0 36.0 - 46.0 %   MCV 96.9 80.0 - 100.0 fL   MCH 30.1 26.0 - 34.0 pg   MCHC 31.1 30.0 - 36.0 g/dL   RDW 14.1 11.5 - 15.5 %   Platelets 372 150 - 400 K/uL   nRBC 0.0 0.0 - 0.2 %   Neutrophils Relative % 55 %   Neutro Abs 3.0 1.7 - 7.7 K/uL   Lymphocytes Relative 34 %   Lymphs Abs 1.9 0.7 - 4.0 K/uL   Monocytes Relative 7 %   Monocytes Absolute 0.4 0.1 - 1.0 K/uL   Eosinophils Relative 2 %   Eosinophils Absolute 0.1 0.0 - 0.5 K/uL   Basophils Relative 1 %   Basophils Absolute 0.1 0.0 -  0.1 K/uL   Immature Granulocytes 1 %   Abs Immature Granulocytes 0.04 0.00 - 0.07 K/uL    Comment: Performed at [facility], [address] [place], [place] [address]  Basic metabolic panel     Status: Abnormal   Collection Time: 09/27/21 10:19 PM  Result Value Ref Range   Sodium 139 135 - 145 mmol/L   Potassium 3.9 3.5 - 5.1 mmol/L   Chloride 108 98 - 111 mmol/L   CO2 24 22 - 32 mmol/L   Glucose, Bld 104 (H) 70 - 99 mg/dL    Comment: Glucose reference range applies only to samples taken after fasting for at least 8 hours.   BUN 22 8 - 23 mg/dL   Creatinine, Ser 1.23 (H) 0.44 - 1.00 mg/dL   Calcium 9.0 8.9 - 10.3 mg/dL   GFR, Estimated 46 (L) >60 mL/min    Comment: (NOTE) Calculated using the CKD-EPI Creatinine Equation (2021)    Anion gap 7 5 - 15    Comment: Performed at [facility], [address] [place], [place] [address]  Valproic acid level     Status: None   Collection Time: 09/27/21 10:19 PM  Result Value Ref Range   Valproic Acid Lvl 61 50.0 - 100.0 ug/mL    Comment: Performed at [facility], [address], [place], [place] [address]    Current Facility-Administered Medications  Medication Dose Route Frequency Provider Last Rate Last Admin   divalproex (DEPAKOTE SPRINKLE) capsule 250 mg  250 mg Oral TID Patrecia Pour, NP       levothyroxine (SYNTHROID) tablet 50 mcg  50 mcg Oral Daily Patrecia Pour, NP       LORazepam (ATIVAN) tablet 0.25 mg  0.25 mg Oral Q6H Nicholad Kautzman, Asa Saunas, NP       risperiDONE (RISPERDAL) tablet 0.5 mg  0.5 mg Oral BID Patrecia Pour, NP       Current Outpatient Medications  Medication Sig Dispense Refill   acetaminophen (TYLENOL) 160 mg/5 mL SOLN Take 15 mLs by mouth 3 (three) times daily as needed.     aspirin 81 MG chewable tablet Chew 81 mg by mouth daily.     divalproex (DEPAKOTE SPRINKLE) 125 MG capsule Take 2 capsules (250 mg total) by mouth 3 (three) times daily. 180 capsule 1   levothyroxine (SYNTHROID) 50 MCG tablet Take 50 mcg by mouth daily.     LORazepam (ATIVAN) 0.5 MG tablet Take 0.5 tablets (0.25 mg total) by mouth every 6 (six) hours. 60 tablet 0   risperiDONE (RISPERDAL) 0.5 MG tablet Take 0.5 mg by mouth at bedtime.      Musculoskeletal: Strength & Muscle Tone: within normal limits Gait & Station: normal Patient leans: N/A  Psychiatric Specialty Exam: Physical Exam Vitals and nursing note reviewed.  Constitutional:      Appearance: Normal appearance.  HENT:     Head: Normocephalic.     Nose: Nose normal.  Pulmonary:     Effort: Pulmonary effort is normal.  Musculoskeletal:     Cervical back: Normal range of motion.  Neurological:     General: No focal deficit present.     Mental Status: She is alert.  Psychiatric:        Attention and Perception: Attention and perception normal.        Mood and Affect:  Mood and affect normal.        Speech: Speech normal.  Behavior: Behavior normal. Behavior is cooperative.        Thought Content: Thought content normal.        Cognition and Memory: Cognition is impaired. Memory is impaired.        Judgment: Judgment normal.    Review of Systems  Constitutional:  Positive for malaise/fatigue.  Psychiatric/Behavioral:  Positive for memory loss.   All other systems reviewed and are negative.  Blood pressure 132/76, pulse 72, temperature 97.8 F (36.6 C), temperature source Oral, resp. rate 17, height '5\' 4"'$  (1.626 m), weight 65.7 kg, SpO2 98 %.Body mass index is 24.86 kg/m.  General Appearance: Casual  Eye Contact:  Good  Speech:  Normal Rate  Volume:  Normal  Mood:  Euthymic  Affect:  Congruent  Thought Process:  Coherent and Descriptions of Associations: Intact  Orientation:  Full (Time, Place, and Person)  Thought Content:  WDL and Logical  Suicidal Thoughts:  No  Homicidal Thoughts:  No  Memory:  Immediate;   Fair Recent;   Poor Remote;   Fair  Judgement:  Fair  Insight:  Fair  Psychomotor Activity:  Normal  Concentration:  Concentration: Fair and Attention Span: Fair  Recall:  [employer] of Knowledge:  Fair  Language:  Fair  Akathisia:  No  Handed:  Right  AIMS (if indicated):     Assets:  Housing Leisure Time Resilience Social Support  ADL's:  Intact  Cognition:  Impaired,  Moderate  Sleep:      cla  Physical Exam: Physical Exam Vitals and nursing note reviewed.  Constitutional:      Appearance: Normal appearance.  HENT:     Head: Normocephalic.     Nose: Nose normal.  Pulmonary:     Effort: Pulmonary effort is normal.  Musculoskeletal:     Cervical back: Normal range of motion.  Neurological:     General: No focal deficit present.     Mental Status: She is alert.  Psychiatric:        Attention and Perception: Attention and perception normal.        Mood and Affect: Mood and affect normal.        Speech:  Speech normal.        Behavior: Behavior normal. Behavior is cooperative.        Thought Content: Thought content normal.        Cognition and Memory: Cognition is impaired. Memory is impaired.        Judgment: Judgment normal.   Review of Systems  Constitutional:  Positive for malaise/fatigue.  Psychiatric/Behavioral:  Positive for memory loss.   All other systems reviewed and are negative. Blood pressure 132/76, pulse 72, temperature 97.8 F (36.6 C), temperature source Oral, resp. rate 17, height '5\' 4"'$  (1.626 m), weight 65.7 kg, SpO2 98 %. Body mass index is 24.86 kg/m.  Treatment Plan Summary: Dementia with behavioral disturbances: Continue Depakote 250 mg TID Continue Risperdal 0.5 mg at bedtime Continue Ativan 0.25 every six hours PRN anxiety  Disposition: Patient does not meet criteria for psychiatric inpatient admission.  Waylan Boga, NP 09/28/2021 2:40 PM

## 2021-09-29 NOTE — ED Provider Notes (Signed)
-----------------------------------------   10:39 AM on 09/29/2021 ----------------------------------------- Patient's nursing facility [facility] has accepted the patient back to the facility.  Patient has been cleared by psychiatry.  Medical work-up is been largely nonrevealing.  Patient will be discharged home to her nursing facility.   Harvest Dark, MD 09/29/21 1039

## 2021-09-29 NOTE — ED Notes (Signed)
Breakfast Tray given

## 2021-09-29 NOTE — ED Notes (Signed)
Second attempt to call report on this pt to [facility], report given to Pajonal at [facility].

## 2021-09-29 NOTE — ED Notes (Signed)
Per case manager pt is going back to [facility] per Engineer, site, this RN attempted to call report and the phone rang for 5 minutes with no answer and no way to leave a message.

## 2021-09-29 NOTE — ED Notes (Signed)
D/C and questions answered for EMS.  Pt calm and cooperative  on D/C.

## 2021-09-29 NOTE — TOC Initial Note (Signed)
Transition of Care [facility]) - Initial/Assessment Note    Patient Details  Name: Victoria Wilkerson MRN: [id number] Date of Birth: Sep 05, 1945  Transition of [facility]) CM/SW Contact:    Shelbie Hutching, RN Phone Number: 09/29/2021, 10:36 AM  Clinical Narrative:                 Patient does not need to be admitted to the hospital.  RNCM spoke to Neoma Laming at [facility] in Admissions.  She reports that they will accept the patient back.  Patient is long term care at [facility].  ED RN has called report.  ED secretary to set up EMS transport.  TOC signed off.         Patient Goals and CMS Choice        Expected Discharge Plan and Services                                                Prior Living Arrangements/Services                       Activities of Daily Living      Permission Sought/Granted                  Emotional Assessment              Admission diagnosis:  Psych Eval Patient Active Problem List   Diagnosis Date Noted   Hypothyroidism 09/13/2021   CKD (chronic kidney disease) stage 3, GFR 30-59 ml/min (HCC)    Pneumonia due to COVID-19 virus 09/23/2019   Hypertension    Sepsis ([place])    UTI (urinary tract infection)    Adrenal adenoma-right    Acute renal failure superimposed on stage 3a chronic kidney disease ([place])    Dementia with behavioral disturbance [facility])    PCP:  [facility], [place]:   [place] [place] [address] Phone: [phone] Fax: [facility] [address], [place] [address] [place] [address] Phone: [phone] Fax: [phone]     Social Determinants of Health (SDOH) Interventions    Readmission Risk Interventions     View : No data to display.

## 2021-09-29 NOTE — ED Notes (Signed)
Legal Guardian, Ms. Pinnix updated on pt being sent back [facility].

## 2021-09-30 LAB — URINE CULTURE: Culture: >=100000 — AB

## 2021-11-03 ENCOUNTER — Other Ambulatory Visit: Payer: Self-pay

## 2021-11-03 ENCOUNTER — Encounter: Payer: Self-pay | Admitting: Emergency Medicine

## 2021-11-03 ENCOUNTER — Emergency Department: Payer: Medicare Other

## 2021-11-03 ENCOUNTER — Observation Stay
Admission: EM | Admit: 2021-11-03 | Discharge: 2021-11-04 | Disposition: A | Discharge status: Hospice | Payer: Medicare Other | Attending: Hospitalist | Admitting: Hospitalist

## 2021-11-03 DIAGNOSIS — R627 Adult failure to thrive: Secondary | ICD-10-CM | POA: Diagnosis not present

## 2021-11-03 DIAGNOSIS — R4182 Altered mental status, unspecified: Secondary | ICD-10-CM | POA: Diagnosis present

## 2021-11-03 DIAGNOSIS — Z79899 Other long term (current) drug therapy: Secondary | ICD-10-CM | POA: Diagnosis not present

## 2021-11-03 DIAGNOSIS — Z8616 Personal history of COVID-19: Secondary | ICD-10-CM | POA: Insufficient documentation

## 2021-11-03 DIAGNOSIS — I10 Essential (primary) hypertension: Secondary | ICD-10-CM | POA: Diagnosis present

## 2021-11-03 DIAGNOSIS — N390 Urinary tract infection, site not specified: Secondary | ICD-10-CM | POA: Diagnosis present

## 2021-11-03 DIAGNOSIS — I129 Hypertensive chronic kidney disease with stage 1 through stage 4 chronic kidney disease, or unspecified chronic kidney disease: Secondary | ICD-10-CM | POA: Diagnosis not present

## 2021-11-03 DIAGNOSIS — Z8673 Personal history of transient ischemic attack (TIA), and cerebral infarction without residual deficits: Secondary | ICD-10-CM | POA: Insufficient documentation

## 2021-11-03 DIAGNOSIS — R001 Bradycardia, unspecified: Secondary | ICD-10-CM | POA: Insufficient documentation

## 2021-11-03 DIAGNOSIS — N182 Chronic kidney disease, stage 2 (mild): Secondary | ICD-10-CM | POA: Insufficient documentation

## 2021-11-03 DIAGNOSIS — R680 Hypothermia, not associated with low environmental temperature: Secondary | ICD-10-CM | POA: Diagnosis not present

## 2021-11-03 DIAGNOSIS — F03918 Unspecified dementia, unspecified severity, with other behavioral disturbance: Secondary | ICD-10-CM | POA: Diagnosis present

## 2021-11-03 DIAGNOSIS — Z7982 Long term (current) use of aspirin: Secondary | ICD-10-CM | POA: Insufficient documentation

## 2021-11-03 DIAGNOSIS — E039 Hypothyroidism, unspecified: Secondary | ICD-10-CM | POA: Diagnosis present

## 2021-11-03 DIAGNOSIS — A419 Sepsis, unspecified organism: Secondary | ICD-10-CM | POA: Diagnosis present

## 2021-11-03 DIAGNOSIS — F03A18 Unspecified dementia, mild, with other behavioral disturbance: Secondary | ICD-10-CM | POA: Insufficient documentation

## 2021-11-03 DIAGNOSIS — T68XXXA Hypothermia, initial encounter: Secondary | ICD-10-CM

## 2021-11-03 HISTORY — DX: Acute kidney failure, unspecified: N17.9

## 2021-11-03 HISTORY — DX: Benign neoplasm of unspecified adrenal gland: D35.00

## 2021-11-03 LAB — URINALYSIS, ROUTINE W REFLEX MICROSCOPIC
Bilirubin Urine: NEGATIVE
Glucose, UA: NEGATIVE mg/dL
Hgb urine dipstick: NEGATIVE
Ketones, ur: 20 mg/dL — AB
Leukocytes,Ua: NEGATIVE
Nitrite: NEGATIVE
Protein, ur: 30 mg/dL — AB
Specific Gravity, Urine: 1.025 (ref 1.005–1.030)
Squamous Epithelial / HPF: NONE SEEN (ref 0–5)
pH: 5 (ref 5.0–8.0)

## 2021-11-03 LAB — COMPREHENSIVE METABOLIC PANEL
ALT: 27 U/L (ref 0–44)
AST: 26 U/L (ref 15–41)
Albumin: 3.3 g/dL — ABNORMAL LOW (ref 3.5–5.0)
Alkaline Phosphatase: 56 U/L (ref 38–126)
Anion gap: 6 (ref 5–15)
BUN: 31 mg/dL — ABNORMAL HIGH (ref 8–23)
CO2: 29 mmol/L (ref 22–32)
Calcium: 9.7 mg/dL (ref 8.9–10.3)
Chloride: 112 mmol/L — ABNORMAL HIGH (ref 98–111)
Creatinine, Ser: 0.85 mg/dL (ref 0.44–1.00)
GFR, Estimated: >60 mL/min (ref >60)
Glucose, Bld: 96 mg/dL (ref 70–99)
Potassium: 3.4 mmol/L — ABNORMAL LOW (ref 3.5–5.1)
Sodium: 147 mmol/L — ABNORMAL HIGH (ref 135–145)
Total Bilirubin: 0.9 mg/dL (ref 0.3–1.2)
Total Protein: 7.2 g/dL (ref 6.5–8.1)

## 2021-11-03 LAB — CBC WITH DIFFERENTIAL/PLATELET
Abs Immature Granulocytes: 0.02 10*3/uL (ref 0.00–0.07)
Basophils Absolute: 0 10*3/uL (ref 0.0–0.1)
Basophils Relative: 1 %
Eosinophils Absolute: 0.2 10*3/uL (ref 0.0–0.5)
Eosinophils Relative: 3 %
HCT: 47.4 % — ABNORMAL HIGH (ref 36.0–46.0)
Hemoglobin: 14.6 g/dL (ref 12.0–15.0)
Immature Granulocytes: 0 %
Lymphocytes Relative: 21 %
Lymphs Abs: 1.4 10*3/uL (ref 0.7–4.0)
MCH: 29 pg (ref 26.0–34.0)
MCHC: 30.8 g/dL (ref 30.0–36.0)
MCV: 94 fL (ref 80.0–100.0)
Monocytes Absolute: 0.6 10*3/uL (ref 0.1–1.0)
Monocytes Relative: 9 %
Neutro Abs: 4.4 10*3/uL (ref 1.7–7.7)
Neutrophils Relative %: 66 %
Platelets: 344 10*3/uL (ref 150–400)
RBC: 5.04 MIL/uL (ref 3.87–5.11)
RDW: 13.3 % (ref 11.5–15.5)
WBC: 6.6 10*3/uL (ref 4.0–10.5)
nRBC: 0 % (ref 0.0–0.2)

## 2021-11-03 LAB — TSH: TSH: 2.105 u[IU]/mL (ref 0.350–4.500)

## 2021-11-03 LAB — VALPROIC ACID LEVEL: Valproic Acid Lvl: 38 ug/mL — ABNORMAL LOW (ref 50.0–100.0)

## 2021-11-03 LAB — LACTIC ACID, PLASMA: Lactic Acid, Venous: 1.4 mmol/L (ref 0.5–1.9)

## 2021-11-03 LAB — T4, FREE: Free T4: 1.21 ng/dL — ABNORMAL HIGH (ref 0.61–1.12)

## 2021-11-03 MED ORDER — SODIUM CHLORIDE 0.9 % IV SOLN: Freq: Once | INTRAVENOUS | Status: AC

## 2021-11-03 MED ORDER — HALOPERIDOL LACTATE 5 MG/ML IJ SOLN
2.5000 mg | Freq: Once | INTRAMUSCULAR | Status: AC
  Administered 2021-11-04: 2.5 mg via INTRAVENOUS
  Filled 2021-11-03: qty 1

## 2021-11-03 MED ORDER — SODIUM CHLORIDE 0.9 % IV BOLUS
1000.0000 mL | Freq: Once | INTRAVENOUS | Status: AC
  Administered 2021-11-03: 1000 mL via INTRAVENOUS

## 2021-11-03 MED ORDER — LORAZEPAM 2 MG/ML IJ SOLN
1.0000 mg | Freq: Once | INTRAMUSCULAR | Status: AC
Start: 2021-11-03 — End: 2021-11-03
  Administered 2021-11-03: 1 mg via INTRAVENOUS
  Filled 2021-11-03: qty 1

## 2021-11-03 NOTE — ED Triage Notes (Signed)
Patient to ED via [facility] from [facility]. Patient has been failure to thrive per facility and not eating/drinking for the past two weeks. Patient has advance dementia per facility.

## 2021-11-03 NOTE — ED Notes (Addendum)
Patient placed on bare hugger at this time with blankets on top. Purewick applied. Family member at bedside. Cinda Quest, MD aware.

## 2021-11-03 NOTE — H&P (Signed)
History and Physical    Patient: Victoria Wilkerson JIR:[id number] DOB: 10-Aug-1945 DOA: 11/03/2021 DOS: the patient was seen and examined on 11/04/2021 PCP: [facility] [place]  Patient coming from: SNF  Chief Complaint:  Chief Complaint  Patient presents with   Altered Mental Status   HPI: Victoria Wilkerson is a 76 y.o. female with medical history significant of hypertension, chronic kidney disease, dementia, nursing home resident coming to [place] for altered mental status. Per ED provider on initial presentation patient was hypothermic.  HPI is limited secondary to patient's dementia as his review of systems. Per nurses note pt brought for FTT from SNF.  Review of Systems: unable to review all systems due to the inability of the patient to answer questions. Past Medical History:  Diagnosis Date   Acute renal failure superimposed on stage 3a chronic kidney disease (HCC)    Adrenal adenoma-right    CKD (chronic kidney disease) stage 3, GFR 30-59 ml/min (HCC)    CKD (chronic kidney disease) stage 3, GFR 30-59 ml/min (HCC)    Hypertension    Mild dementia ([place])    Pneumonia due to COVID-19 virus 09/23/2019   TIA (transient ischemic attack)    Past Surgical History:  Procedure Laterality Date   CARPAL TUNNEL RELEASE     Social History:  reports that she has never smoked. She has never used smokeless tobacco. She reports that she does not drink alcohol and does not use drugs.  No Known Allergies  No family history on file.  Prior to Admission medications   Medication Sig Start Date End Date Taking? Authorizing Provider  acetaminophen (TYLENOL) 160 mg/5 mL SOLN Take 15 mLs by mouth 3 (three) times daily as needed. 09/12/21   [provider]  aspirin 81 MG chewable tablet Chew 81 mg by mouth daily. 09/12/21   [provider]  divalproex (DEPAKOTE SPRINKLE) 125 MG capsule Take 2 capsules (250 mg total) by mouth 3 (three) times daily. 09/16/21   Annita Brod,  MD  levothyroxine (SYNTHROID) 50 MCG tablet Take 50 mcg by mouth daily. 09/05/21   [provider]  LORazepam (ATIVAN) 0.5 MG tablet Take 0.5 tablets (0.25 mg total) by mouth every 6 (six) hours. 09/16/21   Annita Brod, MD  risperiDONE (RISPERDAL) 0.5 MG tablet Take 0.5 mg by mouth at bedtime. 08/29/21   [provider]    Physical Exam: Vitals:   11/03/21 1830 11/03/21 1957 11/03/21 2031 11/03/21 2353  BP: (!) 129/58 (!) 112/92  (!) 136/92  Pulse: 80 (!) 105  92  Resp: '15 17  17  '$ Temp:   98.1 F (36.7 C)   TempSrc:   Rectal   SpO2: 99% 97%  100%  Physical Exam Vitals reviewed.  Constitutional:      General: She is not in acute distress.    Appearance: She is ill-appearing. She is not toxic-appearing.  HENT:     Head: Normocephalic and atraumatic.     Nose: Nose normal.  Eyes:     Extraocular Movements: Extraocular movements intact.  Cardiovascular:     Rate and Rhythm: Normal rate and regular rhythm.  Pulmonary:     Effort: No respiratory distress.     Breath sounds: No wheezing, rhonchi or rales.  Abdominal:     General: Bowel sounds are normal. There is no distension.     Palpations: Abdomen is soft.     Tenderness: There is abdominal tenderness. There is no guarding.  Musculoskeletal:  Right lower leg: No edema.     Left lower leg: No edema.  Skin:    General: Skin is warm.  Neurological:     General: No focal deficit present.     Mental Status: She is disoriented.     Cranial Nerves: Cranial nerves 2-12 are intact.     Motor: Motor function is intact. No weakness.  Psychiatric:        Attention and Perception: She is inattentive.        Speech: She is noncommunicative.        Behavior: Behavior is uncooperative.   Data Reviewed: Results for orders placed or performed during the hospital encounter of 11/03/21 (from the past 24 hour(s))  CBC with Differential     Status: Abnormal   Collection Time: 11/03/21 12:57 PM  Result Value Ref  Range   WBC 6.6 4.0 - 10.5 K/uL   RBC 5.04 3.87 - 5.11 MIL/uL   Hemoglobin 14.6 12.0 - 15.0 g/dL   HCT 47.4 (H) 36.0 - 46.0 %   MCV 94.0 80.0 - 100.0 fL   MCH 29.0 26.0 - 34.0 pg   MCHC 30.8 30.0 - 36.0 g/dL   RDW 13.3 11.5 - 15.5 %   Platelets 344 150 - 400 K/uL   nRBC 0.0 0.0 - 0.2 %   Neutrophils Relative % 66 %   Neutro Abs 4.4 1.7 - 7.7 K/uL   Lymphocytes Relative 21 %   Lymphs Abs 1.4 0.7 - 4.0 K/uL   Monocytes Relative 9 %   Monocytes Absolute 0.6 0.1 - 1.0 K/uL   Eosinophils Relative 3 %   Eosinophils Absolute 0.2 0.0 - 0.5 K/uL   Basophils Relative 1 %   Basophils Absolute 0.0 0.0 - 0.1 K/uL   Immature Granulocytes 0 %   Abs Immature Granulocytes 0.02 0.00 - 0.07 K/uL  Lactic acid, plasma     Status: None   Collection Time: 11/03/21 12:57 PM  Result Value Ref Range   Lactic Acid, Venous 1.4 0.5 - 1.9 mmol/L  Comprehensive metabolic panel     Status: Abnormal   Collection Time: 11/03/21  1:40 PM  Result Value Ref Range   Sodium 147 (H) 135 - 145 mmol/L   Potassium 3.4 (L) 3.5 - 5.1 mmol/L   Chloride 112 (H) 98 - 111 mmol/L   CO2 29 22 - 32 mmol/L   Glucose, Bld 96 70 - 99 mg/dL   BUN 31 (H) 8 - 23 mg/dL   Creatinine, Ser 0.85 0.44 - 1.00 mg/dL   Calcium 9.7 8.9 - 10.3 mg/dL   Total Protein 7.2 6.5 - 8.1 g/dL   Albumin 3.3 (L) 3.5 - 5.0 g/dL   AST 26 15 - 41 U/L   ALT 27 0 - 44 U/L   Alkaline Phosphatase 56 38 - 126 U/L   Total Bilirubin 0.9 0.3 - 1.2 mg/dL   GFR, Estimated >60 >60 mL/min   Anion gap 6 5 - 15  TSH     Status: None   Collection Time: 11/03/21  1:55 PM  Result Value Ref Range   TSH 2.105 0.350 - 4.500 uIU/mL  T4, free     Status: Abnormal   Collection Time: 11/03/21  1:55 PM  Result Value Ref Range   Free T4 1.21 (H) 0.61 - 1.12 ng/dL  Urinalysis, Routine w reflex microscopic     Status: Abnormal   Collection Time: 11/03/21  8:31 PM  Result Value Ref Range   Color,  Urine AMBER (A) YELLOW   APPearance HAZY (A) CLEAR   Specific Gravity,  Urine 1.025 1.005 - 1.030   pH 5.0 5.0 - 8.0   Glucose, UA NEGATIVE NEGATIVE mg/dL   Hgb urine dipstick NEGATIVE NEGATIVE   Bilirubin Urine NEGATIVE NEGATIVE   Ketones, ur 20 (A) NEGATIVE mg/dL   Protein, ur 30 (A) NEGATIVE mg/dL   Nitrite NEGATIVE NEGATIVE   Leukocytes,Ua NEGATIVE NEGATIVE   RBC / HPF 0-5 0 - 5 RBC/hpf   WBC, UA 0-5 0 - 5 WBC/hpf   Bacteria, UA RARE (A) NONE SEEN   Squamous Epithelial / LPF NONE SEEN 0 - 5   Mucus PRESENT    Amorphous Crystal PRESENT   Valproic acid level     Status: Abnormal   Collection Time: 11/03/21  9:56 PM  Result Value Ref Range   Valproic Acid Lvl 38 (L) 50.0 - 100.0 ug/mL     Assessment and Plan: * FTT (failure to thrive) in adult Pt is brought for FTT. Was admitted last month for UTI. Pt is nonverbal but ed note says pt was speaking.  Swallow eval. D/W  family about providing ongoing nutrition for patient.      UTI (urinary tract infection) We will obtain U/A and culture.    Sepsis ([place]) ? Infection but pt meets sepsis criteria for suspected infection and hypothermia.  Lactic is  Negative.  We will culture urine and blood.  Negative chest xray.  Negative head ct.    Dementia with behavioral disturbance ([employer]) We will cont with home meds once med rec available.  Swallow eval.    Hypertension Blood pressure (!) 136/92, pulse 92, temperature 98.1 F (36.7 C), temperature source Rectal, resp. rate 17, SpO2 100 %. BP is controlled we will order PRN hydralazine.    Hypothyroidism Cont levothyroxine once med rec and pt passes swallow.    Advance Care Planning:    Code Status: Full Code   Consults:  None   Family Communication:  Pinnix,Bobbie J (Daughter)  [phone] (Home Phone)  Severity of Illness: The appropriate patient status for this patient is OBSERVATION. Observation status is judged to be reasonable and necessary in order to provide the required intensity of service to ensure the patient's safety.  The patient's presenting symptoms, physical exam findings, and initial radiographic and laboratory data in the context of their medical condition is felt to place them at decreased risk for further clinical deterioration. Furthermore, it is anticipated that the patient will be medically stable for discharge from the hospital within 2 midnights of admission.   Author: Para Skeans, MD 11/04/2021 12:41 AM  For on call review [email or web address].

## 2021-11-03 NOTE — ED Provider Notes (Signed)
[facility] Provider Note    Event Date/Time   First MD Initiated Contact with Patient 11/03/21 1243     (approximate)   History   Altered Mental Status   HPI  Victoria Wilkerson is a 75 y.o. female patient comes from nursing facility.  She has not been eating or drinking much lately.  She will look at me and follow commands but does not speak much.  He is not acting like herself.      Physical Exam   Triage Vital Signs: ED Triage Vitals  Enc Vitals Group     BP 11/03/21 1245 (!) 146/83     Pulse Rate 11/03/21 1245 (!) 48     Resp 11/03/21 1245 16     Temp 11/03/21 1308 (!) 93 F (33.9 C)     Temp Source 11/03/21 1308 Rectal     SpO2 11/03/21 1238 100 %     Weight --      Height --      Head Circumference --      Peak Flow --      Pain Score --      Pain Loc --      Pain Edu? --      Excl. in [place]? --     Most recent vital signs: Vitals:   11/03/21 1450 11/03/21 1522  BP: (!) 93/55   Pulse: (!) 59   Resp: 10   Temp:  (!) 94.7 F (34.8 C)  SpO2: 99%      General: Awake, alert but slow to respond and not speaking no distress.  Head normocephalic atraumatic Neck no apparent tenderness Mouth patient not opening very much but is not dry. CV:  Good peripheral perfusion.  Resp:  Normal effort.  Lungs are clear Abd:  No distention.  Soft bowel sounds positive no tenderness Extremities: No edema   ED Results / Procedures / Treatments   Labs (all labs ordered are listed, but only abnormal results are displayed) Labs Reviewed  CBC WITH DIFFERENTIAL/PLATELET - Abnormal; Notable for the following components:      Result Value   HCT 47.4 (*)    All other components within normal limits  COMPREHENSIVE METABOLIC PANEL - Abnormal; Notable for the following components:   Sodium 147 (*)    Potassium 3.4 (*)    Chloride 112 (*)    BUN 31 (*)    Albumin 3.3 (*)    All other components within normal limits  LACTIC ACID, PLASMA  URINALYSIS,  ROUTINE W REFLEX MICROSCOPIC  THYROID PANEL WITH TSH     EKG  EKG read and interpreted by me shows sinus bradycardia rate of 44 normal axis some slight ST segment sagging inferiorly which is better than previous EKGs.   RADIOLOGY Chest x-ray reviewed by me may show some increased markings either in the soft tissue or in the lung on the left side by the heart.  It is difficult to tell await for the radiologist report.   PROCEDURES:  Critical Care performed:   Procedures   MEDICATIONS ORDERED IN ED: Medications  sodium chloride 0.9 % bolus 1,000 mL (1,000 mLs Intravenous New Bag/Given 11/03/21 1507)     IMPRESSION / MDM / ASSESSMENT AND PLAN / ED COURSE  I reviewed the triage vital signs and the nursing notes. Patient is bradycardic and hypotensive.  Labs are pending.  I will have to sign the patient out to oncoming provider.  Most likely will have to  admit. Differential diagnosis includes, but is not limited to, patient could potentially be hypovolemic or septic or hypothyroid or near death.  Patient's presentation is most consistent with acute presentation with potential threat to life or bodily function.  The patient is on the cardiac monitor to evaluate for evidence of arrhythmia and/or significant heart rate changes.      FINAL CLINICAL IMPRESSION(S) / ED DIAGNOSES   Final diagnoses:  Altered mental status, unspecified altered mental status type  Hypothermia, initial encounter  Bradycardia     Rx / DC Orders   ED Discharge Orders     None        Note:  This document was prepared using Dragon voice recognition software and may include unintentional dictation errors.   Nena Polio, MD 11/03/21 1539

## 2021-11-04 ENCOUNTER — Encounter: Payer: Self-pay | Admitting: Internal Medicine

## 2021-11-04 DIAGNOSIS — Z515 Encounter for palliative care: Secondary | ICD-10-CM | POA: Diagnosis not present

## 2021-11-04 DIAGNOSIS — Z7189 Other specified counseling: Secondary | ICD-10-CM

## 2021-11-04 DIAGNOSIS — R627 Adult failure to thrive: Secondary | ICD-10-CM | POA: Diagnosis present

## 2021-11-04 LAB — CBC
HCT: 42.9 % (ref 36.0–46.0)
Hemoglobin: 13.2 g/dL (ref 12.0–15.0)
MCH: 29.1 pg (ref 26.0–34.0)
MCHC: 30.8 g/dL (ref 30.0–36.0)
MCV: 94.5 fL (ref 80.0–100.0)
Platelets: 324 10*3/uL (ref 150–400)
RBC: 4.54 MIL/uL (ref 3.87–5.11)
RDW: 13.4 % (ref 11.5–15.5)
WBC: 6.4 10*3/uL (ref 4.0–10.5)
nRBC: 0 % (ref 0.0–0.2)

## 2021-11-04 LAB — COMPREHENSIVE METABOLIC PANEL
ALT: 27 U/L (ref 0–44)
AST: 26 U/L (ref 15–41)
Albumin: 3.3 g/dL — ABNORMAL LOW (ref 3.5–5.0)
Alkaline Phosphatase: 56 U/L (ref 38–126)
Anion gap: 4 — ABNORMAL LOW (ref 5–15)
BUN: 29 mg/dL — ABNORMAL HIGH (ref 8–23)
CO2: 26 mmol/L (ref 22–32)
Calcium: 9 mg/dL (ref 8.9–10.3)
Chloride: 116 mmol/L — ABNORMAL HIGH (ref 98–111)
Creatinine, Ser: 0.89 mg/dL (ref 0.44–1.00)
GFR, Estimated: >60 mL/min (ref >60)
Glucose, Bld: 83 mg/dL (ref 70–99)
Potassium: 3.3 mmol/L — ABNORMAL LOW (ref 3.5–5.1)
Sodium: 146 mmol/L — ABNORMAL HIGH (ref 135–145)
Total Bilirubin: 0.9 mg/dL (ref 0.3–1.2)
Total Protein: 7.1 g/dL (ref 6.5–8.1)

## 2021-11-04 LAB — THYROID PANEL WITH TSH
Free Thyroxine Index: 2.7 (ref 1.2–4.9)
T3 Uptake Ratio: 34 % (ref 24–39)
T4, Total: 8 ug/dL (ref 4.5–12.0)
TSH: 2.32 u[IU]/mL (ref 0.450–4.500)

## 2021-11-04 MED ORDER — ACETAMINOPHEN 650 MG RE SUPP: 650.0000 mg | Freq: Four times a day (QID) | RECTAL | Status: DC | PRN

## 2021-11-04 MED ORDER — LORAZEPAM 1 MG PO TABS: 1.0000 mg | ORAL_TABLET | ORAL | Status: DC | PRN

## 2021-11-04 MED ORDER — MORPHINE SULFATE (PF) 2 MG/ML IV SOLN: 1.0000 mg | INTRAVENOUS | Status: DC | PRN

## 2021-11-04 MED ORDER — GLYCOPYRROLATE 1 MG PO TABS: 1.0000 mg | ORAL_TABLET | ORAL | Status: DC | PRN

## 2021-11-04 MED ORDER — SODIUM CHLORIDE 0.9% FLUSH
3.0000 mL | Freq: Two times a day (BID) | INTRAVENOUS | Status: DC
  Administered 2021-11-04: 3 mL via INTRAVENOUS

## 2021-11-04 MED ORDER — HALOPERIDOL LACTATE 2 MG/ML PO CONC: 0.5000 mg | ORAL | Status: DC | PRN

## 2021-11-04 MED ORDER — POLYVINYL ALCOHOL 1.4 % OP SOLN: 1.0000 [drp] | Freq: Four times a day (QID) | OPHTHALMIC | 0 refills | Status: AC | PRN

## 2021-11-04 MED ORDER — HALOPERIDOL LACTATE 5 MG/ML IJ SOLN
0.5000 mg | INTRAMUSCULAR | Status: DC | PRN
  Administered 2021-11-04: 0.5 mg via INTRAVENOUS
  Filled 2021-11-04: qty 1

## 2021-11-04 MED ORDER — HYDROCODONE-ACETAMINOPHEN 5-325 MG PO TABS: 1.0000 | ORAL_TABLET | ORAL | Status: DC | PRN

## 2021-11-04 MED ORDER — HYDRALAZINE HCL 20 MG/ML IJ SOLN
5.0000 mg | INTRAMUSCULAR | Status: DC | PRN
  Administered 2021-11-04: 5 mg via INTRAVENOUS
  Filled 2021-11-04: qty 1

## 2021-11-04 MED ORDER — LORAZEPAM 2 MG/ML PO CONC: 1.0000 mg | ORAL | 0 refills | Status: AC | PRN

## 2021-11-04 MED ORDER — ACETAMINOPHEN 325 MG PO TABS: 650.0000 mg | ORAL_TABLET | Freq: Four times a day (QID) | ORAL | Status: DC | PRN

## 2021-11-04 MED ORDER — ONDANSETRON HCL 4 MG/2ML IJ SOLN: 4.0000 mg | Freq: Four times a day (QID) | INTRAMUSCULAR | Status: DC | PRN

## 2021-11-04 MED ORDER — TRAZODONE HCL 50 MG PO TABS: 25.0000 mg | ORAL_TABLET | Freq: Every evening | ORAL | Status: DC | PRN

## 2021-11-04 MED ORDER — GLYCOPYRROLATE 0.2 MG/ML IJ SOLN: 0.2000 mg | INTRAMUSCULAR | Status: DC | PRN

## 2021-11-04 MED ORDER — MORPHINE SULFATE (PF) 2 MG/ML IV SOLN: 2.0000 mg | INTRAVENOUS | Status: DC | PRN

## 2021-11-04 MED ORDER — POLYVINYL ALCOHOL 1.4 % OP SOLN: 1.0000 [drp] | Freq: Four times a day (QID) | OPHTHALMIC | Status: DC | PRN

## 2021-11-04 MED ORDER — LORAZEPAM 2 MG/ML IJ SOLN
1.0000 mg | INTRAMUSCULAR | Status: DC | PRN
  Administered 2021-11-04: 1 mg via INTRAVENOUS
  Filled 2021-11-04: qty 1

## 2021-11-04 MED ORDER — BIOTENE DRY MOUTH MT LIQD: 15.0000 mL | OROMUCOSAL | Status: DC | PRN

## 2021-11-04 MED ORDER — LORAZEPAM 2 MG/ML PO CONC: 1.0000 mg | ORAL | Status: DC | PRN

## 2021-11-04 MED ORDER — ONDANSETRON 4 MG PO TBDP: 4.0000 mg | ORAL_TABLET | Freq: Four times a day (QID) | ORAL | Status: DC | PRN

## 2021-11-04 MED ORDER — HEPARIN SODIUM (PORCINE) 5000 UNIT/ML IJ SOLN
5000.0000 [IU] | Freq: Three times a day (TID) | INTRAMUSCULAR | Status: DC
  Administered 2021-11-04 (×3): 5000 [IU] via SUBCUTANEOUS
  Filled 2021-11-04 (×3): qty 1

## 2021-11-04 MED ORDER — HALOPERIDOL 0.5 MG PO TABS: 0.5000 mg | ORAL_TABLET | ORAL | Status: DC | PRN

## 2021-11-04 NOTE — Discharge Summary (Signed)
Physician Discharge Summary   Victoria Wilkerson  female DOB: 15-Nov-1945  HUD:[id number]  PCP: [facility] [place] date: 11/03/2021 Discharge date: 11/04/2021  Admitted From: SNF Disposition:  hospice facility CODE STATUS: DNR   Hospital Course:  For full details, please see H&P, progress notes, consult notes and ancillary notes.  Briefly,  Victoria Wilkerson is a 76 y.o. female with medical history significant of hypertension, chronic kidney disease, dementia, nursing home resident coming to [place] due to FTT and not eating/drinking for the past 2 weeks.    Patient was hypothermic to 93 (rectal) on presentation.  Per daughter, pt has stopped talking, eating and drinking.  SNF had been giving pt IVF PTA.  Palliative care was consulted, and family decided on comfort care and hospice facility.  Comfort care status --symptom management per hospice facility provider  * FTT (failure to thrive) in adult  UTI, ruled out --UA non-infectious   Sepsis, ruled out --no source of infection found.   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  Discharge Diagnoses:  Principal Problem:   FTT (failure to thrive) in adult Active Problems:   UTI (urinary tract infection)   Sepsis ([place])   Dementia with behavioral disturbance (HCC)   Hypertension   Hypothyroidism   30 Day Unplanned Readmission Risk Score    Flowsheet Row ED to Hosp-Admission (Discharged) from 09/13/2021 in [place]  30 Day Unplanned Readmission Risk Score (%) 12.22 Filed at 09/16/2021 1600       This score is the patient's risk of an unplanned readmission within 30 days of being discharged (0  -100%). The score is based on dignosis, age, lab data, medications, orders, and past utilization.   Low:  0-14.9   Medium: 15-21.9   High: 22-29.9   Extreme: 30 and above         Discharge Instructions:  Allergies as of 11/04/2021   No Known Allergies      Medication List     STOP taking these medications    acetaminophen 160 mg/5 mL Soln Commonly known as: TYLENOL   acetaminophen 325 MG tablet Commonly known as: TYLENOL   aspirin 81 MG chewable tablet   donepezil 5 MG tablet Commonly known as: ARICEPT   DSS 100 MG Caps   levothyroxine 50 MCG tablet Commonly known as: SYNTHROID   LORazepam 0.5 MG tablet Commonly known as: ATIVAN Replaced by: LORazepam 2 MG/ML concentrated solution   melatonin 5 MG Tabs       TAKE these medications    divalproex 125 MG capsule Commonly known as: DEPAKOTE SPRINKLE Take 2 capsules (250 mg total) by mouth 3 (three) times daily.   LORazepam 2 MG/ML concentrated solution Commonly known as: ATIVAN Place 0.5 mLs (1 mg total) under the tongue every 4 (four) hours as needed for anxiety. Replaces: LORazepam 0.5 MG tablet   polyvinyl alcohol 1.4 % ophthalmic solution Commonly known as: LIQUIFILM TEARS Place 1 drop into both eyes 4 (four) times daily as needed for dry eyes.   risperiDONE 1 MG tablet Commonly known as: RISPERDAL Take 1 mg by mouth at bedtime.          No Known Allergies  The results of significant diagnostics from this hospitalization (including imaging, microbiology, ancillary and laboratory) are listed below for reference.   Consultations:   Procedures/Studies: CT Head Wo Contrast  Result Date: 11/03/2021 CLINICAL DATA:  Ams. Patient to ED via [facility] from [facility] Patient has been failure to thrive per facility and not eating/drinking for the past two weeks. Patient has advance dementia per facility. EXAM: CT HEAD WITHOUT CONTRAST TECHNIQUE: Contiguous axial images were obtained from the base  of the skull through the vertex without intravenous contrast. RADIATION DOSE REDUCTION: This exam was performed according to the departmental dose-optimization program which includes automated exposure control, adjustment of the mA and/or kV according to patient size and/or use of iterative reconstruction technique. COMPARISON:  CT head 09/13/2021 FINDINGS: Brain: Patchy and confluent areas of decreased attenuation are noted throughout the deep and periventricular white matter of the cerebral hemispheres bilaterally, compatible with chronic microvascular ischemic disease. Left occipital encephalomalacia. No evidence of large-territorial acute infarction. No parenchymal hemorrhage. No mass lesion. No extra-axial collection. No mass effect or midline shift. No hydrocephalus. Basilar cisterns are patent. Vascular: No hyperdense vessel. Atherosclerotic calcifications are present within the cavernous internal carotid and vertebral arteries. Skull: No acute fracture or focal lesion. Sinuses/Orbits: Paranasal sinuses and mastoid air cells are clear. The orbits are unremarkable. Other: None. IMPRESSION: No acute intracranial abnormality. Electronically Signed   By: Iven Finn M.D.   On: 11/03/2021 22:50   DG Chest 2 View  Result Date: 11/03/2021 CLINICAL DATA:  Followup abnormal chest x-ray. EXAM: CHEST - 2 VIEW COMPARISON:  Earlier film, same date. FINDINGS: The cardiac silhouette, mediastinal and hilar contours are normal. The lungs are clear. No pleural effusions or pneumothorax. The abnormality on the prior AP portable chest x-ray is no longer identified and was probably artifact IMPRESSION: No acute cardiopulmonary findings. Electronically Signed   By: Marijo Sanes M.D.   On: 11/03/2021 16:20   DG Chest Portable 1 View  Result Date: 11/03/2021 CLINICAL DATA:  Weakness EXAM: PORTABLE CHEST 1 VIEW COMPARISON:  09/13/2021 FINDINGS: No consolidation or edema. No pleural effusion. Normal heart size. Possible  right parasagittal opacity along T10-T12. IMPRESSION: No acute process in the chest. Possible right parasagittal lower thoracic opacity may reflect a mediastinal abnormality or something external to the patient. Correlate with exam and consider chest CT. Electronically Signed   By: Macy Mis M.D.   On: 11/03/2021 13:32      Labs: BNP (last 3 results) Recent Labs    09/13/21 1421  BNP 93.8   Basic Metabolic Panel: Recent Labs  Lab 11/03/21 1340 11/04/21 0200  NA 147* 146*  K 3.4* 3.3*  CL 112* 116*  CO2 29 26  GLUCOSE 96 83  BUN 31* 29*  CREATININE 0.85 0.89  CALCIUM 9.7 9.0   Liver Function Tests: Recent Labs  Lab 11/03/21 1340 11/04/21 0200  AST 26 26  ALT 27 27  ALKPHOS 56 56  BILITOT 0.9 0.9  PROT 7.2 7.1  ALBUMIN 3.3* 3.3*   No results for input(s): "LIPASE", "AMYLASE" in the last 168 hours. No results for input(s): "AMMONIA" in the last 168 hours. CBC: Recent Labs  Lab 11/03/21 1257 11/04/21 0200  WBC 6.6 6.4  NEUTROABS 4.4  --   HGB 14.6 13.2  HCT 47.4* 42.9  MCV 94.0 94.5  PLT 344 324   Cardiac Enzymes: No results for input(s): "CKTOTAL", "CKMB", "CKMBINDEX", "TROPONINI" in the last 168 hours. BNP: Invalid input(s): "POCBNP" CBG: No results  for input(s): "GLUCAP" in the last 168 hours. D-Dimer No results for input(s): "DDIMER" in the last 72 hours. Hgb A1c No results for input(s): "HGBA1C" in the last 72 hours. Lipid Profile No results for input(s): "CHOL", "HDL", "LDLCALC", "TRIG", "CHOLHDL", "LDLDIRECT" in the last 72 hours. Thyroid function studies Recent Labs    11/03/21 2156  TSH 2.320  T4TOTAL 8.0   Anemia work up No results for input(s): "VITAMINB12", "FOLATE", "FERRITIN", "TIBC", "IRON", "RETICCTPCT" in the last 72 hours. Urinalysis    Component Value Date/Time   COLORURINE AMBER (A) 11/03/2021 2031   APPEARANCEUR HAZY (A) 11/03/2021 2031   LABSPEC 1.025 11/03/2021 2031   PHURINE 5.0 11/03/2021 2031   GLUCOSEU NEGATIVE  11/03/2021 2031   HGBUR NEGATIVE 11/03/2021 2031   BILIRUBINUR NEGATIVE 11/03/2021 2031   KETONESUR 20 (A) 11/03/2021 2031   PROTEINUR 30 (A) 11/03/2021 2031   NITRITE NEGATIVE 11/03/2021 2031   LEUKOCYTESUR NEGATIVE 11/03/2021 2031   Sepsis Labs Recent Labs  Lab 11/03/21 1257 11/04/21 0200  WBC 6.6 6.4   Microbiology Recent Results (from the past 240 hour(s))  Culture, blood (Routine X 2) w Reflex to ID Panel     Status: None (Preliminary result)   Collection Time: 11/04/21  2:00 AM   Specimen: BLOOD  Result Value Ref Range Status   Specimen Description BLOOD RIGHT HAND  Final   Special Requests   Final    BOTTLES DRAWN AEROBIC AND ANAEROBIC Blood Culture adequate volume   Culture   Final    NO GROWTH < 12 HOURS Performed at [facility], [address] [place], [place] [address]    Report Status PENDING  Incomplete  Culture, blood (Routine X 2) w Reflex to ID Panel     Status: None (Preliminary result)   Collection Time: 11/04/21  2:00 AM   Specimen: BLOOD  Result Value Ref Range Status   Specimen Description BLOOD RIGHT FOREARM  Final   Special Requests   Final    IN PEDIATRIC BOTTLE Blood Culture results may not be optimal due to an excessive volume of blood received in culture bottles   Culture   Final    NO GROWTH < 12 HOURS Performed at [facility], [address] [place], [place] [address]    Report Status PENDING  Incomplete     Total time spend on discharging this patient, including the last patient exam, discussing the hospital stay, instructions for ongoing care as it relates to all pertinent caregivers, as well as preparing the medical discharge records, prescriptions, and/or referrals as applicable, is 50 minutes.    Enzo Bi, MD  [facility] 11/04/2021, 5:13 PM

## 2021-11-04 NOTE — Progress Notes (Addendum)
1334 Telebox d/c  Speech eval completed, recommends pureed diet with thin liquids   1809 Report given to Sharlyne Pacas at hospice home. 20G L arm in place. Daughters at bedside. Transport in place.

## 2021-11-04 NOTE — Progress Notes (Signed)
[facility]) Hospital Liaison RN Note  Received request from [place], [place] and [place], [facility] for family interest in [facility]. Met with patient and daughters [name] and [name] at bedside to explain services.  Family agreeable to transfer today. I will call to arrange transport once consents are complete.  Please send completed and signed DNR with patient.  Bedside RN, please call report to [phone].  Please call with any hospice related questions.  Thank you, Margaretmary Eddy, BSN, RN [facility] Liaison [phone]

## 2021-11-04 NOTE — Assessment & Plan Note (Signed)
We will obtain U/A and culture.

## 2021-11-04 NOTE — Consult Note (Addendum)
Consultation Note Date: 11/04/2021   Patient Name: Victoria Wilkerson  DOB: 03-12-1946  MRN: [id number]  Age / Sex: 76 y.o., female  PCP: [facility] [place] Referring Physician: Enzo Bi, MD  Reason for Consultation: Establishing goals of care  HPI/Patient Profile: Victoria Wilkerson is a 76 y.o. female with medical history significant of hypertension, chronic kidney disease, dementia, nursing home resident coming to [place] for altered mental status.  Clinical Assessment and Goals of Care: Notes and labs reviewed. Patient is resting in bed. She is restless and fidgeting with sheets. Both daughters are at bedside. They both state daughter [name] is HPOA.    They state patient had been living with daughter [name] for years. She was diagnosed with dementia around 2016. They discuss her gradual decline leading to wandering off, and taking her small grand kids, to around 6 months ago, incontinence and inability to perform ADL's.   They state at her LTC facility she walks for days at a time until she literally falls asleep at th table eating. The daughters tell me her appetite has decreased substantially where she does not really eat or drink, except for sweets, and only small amounts.  We discussed her diagnosis, prognosis, GOC, EOL wishes disposition and options.  Created space and opportunity for patient  to explore thoughts and feelings regarding current medical information.   A detailed discussion was had today regarding advanced directives.  Concepts specific to code status, artifical feeding and hydration, IV antibiotics and rehospitalization were discussed.  The difference between an aggressive medical intervention path and a comfort care path was discussed.  Values and goals of care important to patient and family were attempted to be elicited.  Discussed limitations of medical interventions to prolong  quality of life in some situations and discussed the concept of human mortality. They tell me she is a woman of faith.  The daughters tell me the patient's QOL has been unacceptable for a while. They would like to focus on comfort and dignity for what time she has left. They do not want any further life prolonging care. They would not want a feeding tube. They would like hospice facility placement.   I completed a MOST form today with both daughters at bedside, and the signed original was placed in the chart. A photocopy was placed in the chart to be scanned into EMR. The patient outlined their wishes for the following treatment decisions:  Cardiopulmonary Resuscitation: Do Not Attempt Resuscitation (DNR/No CPR)  Medical Interventions: Comfort Measures: Keep clean, warm, and dry. Use medication by any route, positioning, wound care, and other measures to relieve pain and suffering. Use oxygen, suction and manual treatment of airway obstruction as needed for comfort. Do not transfer to the hospital unless comfort needs cannot be met in current location.  Antibiotics: No antibiotics (use other measures to relieve symptoms)  IV Fluids: No IV fluids (provide other measures to ensure comfort)  Feeding Tube: No feeding tube        SUMMARY OF RECOMMENDATIONS  Full comfort care and hospice facility placement.   Comfort orders placed. Team made aware via group chat.  Prognosis:  < 2 weeks Bites and sips currently, and prior to admission.        Primary Diagnoses: Present on Admission:  FTT (failure to thrive) in adult  Hypothyroidism  Dementia with behavioral disturbance ([place])  UTI (urinary tract infection)  Hypertension  Sepsis ([place])   I have reviewed the medical record, interviewed the patient and family, and examined the patient. The following aspects are pertinent.  Past Medical History:  Diagnosis Date   Acute renal failure superimposed on stage 3a chronic kidney disease (HCC)     Adrenal adenoma-right    CKD (chronic kidney disease) stage 3, GFR 30-59 ml/min (HCC)    CKD (chronic kidney disease) stage 3, GFR 30-59 ml/min (HCC)    Hypertension    Mild dementia ([place])    Pneumonia due to COVID-19 virus 09/23/2019   TIA (transient ischemic attack)    Social History   Socioeconomic History   Marital status: Divorced    Spouse name: Not on file   Number of children: Not on file   Years of education: Not on file   Highest education level: Not on file  Occupational History   Not on file  Tobacco Use   Smoking status: Never   Smokeless tobacco: Never  Substance and Sexual Activity   Alcohol use: Never   Drug use: Never   Sexual activity: Not on file  Other Topics Concern   Not on file  Social History Narrative   Not on file   Social Determinants of Health   Financial Resource Strain: Not on file  Food Insecurity: Not on file  Transportation Needs: Not on file  Physical Activity: Not on file  Stress: Not on file  Social Connections: Not on file   No family history on file. Scheduled Meds:  heparin  5,000 Units Subcutaneous Q8H   sodium chloride flush  3 mL Intravenous Q12H   Continuous Infusions: PRN Meds:.acetaminophen **OR** acetaminophen, hydrALAZINE Medications Prior to Admission:  Prior to Admission medications   Medication Sig Start Date End Date Taking? Authorizing Provider  acetaminophen (TYLENOL) 325 MG tablet Take 650 mg by mouth every 8 (eight) hours as needed for mild pain or moderate pain.   Yes [provider]  aspirin 81 MG chewable tablet Chew 81 mg by mouth daily. 09/12/21  Yes [provider]  divalproex (DEPAKOTE SPRINKLE) 125 MG capsule Take 2 capsules (250 mg total) by mouth 3 (three) times daily. 09/16/21  Yes Annita Brod, MD  Docusate Sodium (DSS) 100 MG CAPS Take 100 mg by mouth daily.   Yes [provider]  donepezil (ARICEPT) 5 MG tablet Take 5 mg by mouth at bedtime.   Yes [provider]  levothyroxine (SYNTHROID) 50 MCG tablet Take 50 mcg by mouth daily. 09/05/21  Yes [provider]  LORazepam (ATIVAN) 0.5 MG tablet Take 0.5 tablets (0.25 mg total) by mouth every 6 (six) hours. Patient taking differently: Take 0.5 mg by mouth every 8 (eight) hours as needed for anxiety. 09/16/21  Yes Annita Brod, MD  melatonin 5 MG TABS Take 10 mg by mouth at bedtime. 10/27/21  Yes [provider]  risperiDONE (RISPERDAL) 1 MG tablet Take 1 mg by mouth at bedtime. 08/29/21  Yes [provider]  acetaminophen (TYLENOL) 160 mg/5 mL SOLN Take 15 mLs by mouth 3 (three) times daily as needed. Patient  not taking: Reported on 11/04/2021 09/12/21   [provider]   No Known Allergies Review of Systems  Unable to perform ROS   Physical Exam Pulmonary:     Effort: Pulmonary effort is normal.  Neurological:     Mental Status: She is alert.     Vital Signs: BP (!) 164/89 (BP Location: Right Arm)   Pulse 84   Temp 98.3 F (36.8 C)   Resp 16   Ht 5' 4"  (1.626 m)   Wt 64.3 kg   SpO2 97%   BMI 24.34 kg/m  Pain Scale: PAINAD       SpO2: SpO2: 97 % O2 Device:SpO2: 97 % O2 Flow Rate: .   IO: Intake/output summary:  Intake/Output Summary (Last 24 hours) at 11/04/2021 1552 Last data filed at 11/04/2021 1459 Gross per 24 hour  Intake 18 ml  Output --  Net 18 ml    LBM: Last BM Date :  (UTA due to patient not speaking) Baseline Weight: Weight: 64.3 kg Most recent weight: Weight: 64.3 kg       Signed by: Asencion Gowda, NP   Please contact Palliative Medicine Team phone at [phone] for questions and concerns.  For individual provider: See Shea Evans

## 2021-11-04 NOTE — Plan of Care (Signed)

## 2021-11-04 NOTE — Assessment & Plan Note (Signed)
?   Infection but pt meets sepsis criteria for suspected infection and hypothermia.  Lactic is  Negative.  We will culture urine and blood.  Negative chest xray.  Negative head ct.

## 2021-11-04 NOTE — Evaluation (Addendum)
Clinical/Bedside Swallow Evaluation Patient Details  Name: Victoria Wilkerson MRN: [id number] Date of Birth: [birth year]  Today's Date: 11/04/2021 Time: SLP Start Time (ACUTE ONLY): 70 SLP Stop Time (ACUTE ONLY): 1340 SLP Time Calculation (min) (ACUTE ONLY): 60 min  Past Medical History:  Past Medical History:  Diagnosis Date   Acute renal failure superimposed on stage 3a chronic kidney disease (HCC)    Adrenal adenoma-right    CKD (chronic kidney disease) stage 3, GFR 30-59 ml/min (HCC)    CKD (chronic kidney disease) stage 3, GFR 30-59 ml/min (HCC)    Hypertension    Mild dementia ([place])    Pneumonia due to COVID-19 virus 09/23/2019   TIA (transient ischemic attack)    Past Surgical History:  Past Surgical History:  Procedure Laterality Date   CARPAL TUNNEL RELEASE     HPI:  Pt is a 76 years old female with past medical history of worsening Dementia with history of hallucinations and agitation during adult daycare, CKD stage III, hypertension, history of TIA.  She admitted to the hospital this admit w/ altered mental status, confusion, and tx for UTI.  MD has noted FTT.  Months back patient had some behavioral issues and had difficulty recognizing people including family members.  She was then put on Depakote, Aricept and risperidone after having consultation with neurology.  As per the family, patient has not been doing too well.  Recently she has been more confused; they state at her LTC facility she walks for days at a time until she literally falls asleep at th table eating. The daughters tell me her appetite has decreased substantially where she does not really eat or drink, except for sweets, and only small amounts.   CXR: No acute cardiopulmonary findings.    Assessment / Plan / Recommendation  Clinical Impression  Pt seen just recently for BSE in 08/2021; dx'd w/ primary oral phase dysphagia then and rec'd a Dysphagia level 2(minced foods) diet w/ Supervision during oral intake.   Pt  is readmitted w/ dxs of FTT and UTI and decreased oral intake from her NH, per chart notes. Noted decline in Cognitive functioning as well.  Pt appears to present w/ oropharyngeal phase dysphagia in setting of declined Cognitive status; Baseline Dementia. Family has reported that pt's Dementia may "worsening"; noted MD/chart notes. ANY Cognitive decline can impact overall awareness/timing of swallow and safety during po tasks which increases risk for aspiration, choking. Pt is Edentulous status at baseline(does not wear Dentures at home per family report).   Pt's risk for aspiration is present d/t Cognitive decline and oral phase deficits, but it can be reduced when following aspiration precautions, using a modified dysphagia diet of Puree consistency, and when given 100% Supervision during feeding of po's to ensure oral clearing and swallowing.  She required mod-max verbal/visual/tactile cues for engagement then follow through w/ po tasks at this session today.   Pt consumed several trials of thin liquids, MOSTLY VIA PINCHED STRAW D/T REDUCED ORAL AWARENESS, then purees. No immediate, overt clinical s/s of aspiration noted: no decline in (low)vocal quality; no cough, and no decline in respiratory status during/post trials. Oral phase was c/b decreased awareness during oral prep stage and SLOW bolus management and oral clearing of the puree boluses given. Sips of thin liquids were given post puree trials to engage more oral activity and oral clearing. This clinician ensure oral clearing b/t trials b/f giving another.  The oral phase deficits appear increased since previous evaluation in 08/2021.  OM Exam appeared to reveal potential THRUSH(white coating on tongue) and also an ulcer/lesion along lower gumline. ANY oral discomfort/pain can deter a pt from wanting to swallow. No unilateral oral weakness noted. Confusion w/ OM tasks and oral care noted. Hand over hand guidance and visual cue were given to  engage pt w/ po tasks.   In setting of baseline Dementia and Cognitive decline, Edentulous status, and her risk for aspiration w/ current swallow presentation, recommend initiation of the dysphagia level 1(pureed foods moistened for ease of oral phase) w/ thin liquids; aspiration precautions; reduce Distractions during meals and engage pt during meals for self-feeding. 100% feeding support. Check for oral clearing b/t bites. Pills Crushed in Puree for safer swallowing as needed. MD updated re: concern for oral THRUSH. Dietician/NSG updated.   ST services recommends follow w/ Palliative Care for [place] and education re: impact of Cognitive decline/Dementia on swallowing. This diet consistency is the least restrictive consistency that can be recommended at this time. Recommend this diet at Discharge. No further acute ST services indicated. F/u at next venue of care as indicated. Suspect pt is close to/at her new baseline re: oropharyngeal phase swallowing. Precautions posted in room. SLP Visit Diagnosis: Dysphagia, oral phase (R13.11);Cognitive communication deficit (R41.841) (baseline Dementia)    Aspiration Risk  Mild aspiration risk;Moderate aspiration risk;Risk for inadequate nutrition/hydration    Diet Recommendation   dysphagia level 1(pureed foods moistened for ease of oral phase) w/ thin liquids - deliver via pinched straw or small cup sip; aspiration precautions; reduce Distractions during meals and engage pt during meals for self-feeding. 100% feeding support. Check for oral clearing b/t bites.   Medication Administration: Crushed with puree    Other  Recommendations Recommended Consults:  (Palliative Care consult; Dietician f/u) Oral Care Recommendations: Oral care BID;Oral care before and after PO;Staff/trained caregiver to provide oral care Other Recommendations:  (n/a)    Recommendations for follow up therapy are one component of a multi-disciplinary discharge planning process, led by the  attending physician.  Recommendations may be updated based on patient status, additional functional criteria and insurance authorization.  Follow up Recommendations Follow physician's recommendations for discharge plan and follow up therapies (next venue of care)      Assistance Recommended at Discharge Frequent or constant Supervision/Assistance  Functional Status Assessment Patient has had a recent decline in their functional status and/or demonstrates limited ability to make significant improvements in function in a reasonable and predictable amount of time  Frequency and Duration  (n/a)   (n/a)       Prognosis Prognosis for Safe Diet Advancement: Guarded Barriers to Reach Goals: Cognitive deficits;Language deficits;Time post onset;Severity of deficits;Behavior Barriers/Prognosis Comment: fidgity in bed; poor awareness      Swallow Study   General Date of Onset: 11/03/21 HPI: Pt is a 76 years old female with past medical history of worsening Dementia with history of hallucinations and agitation during adult daycare, CKD stage III, hypertension, history of TIA.  She admitted to the hospital this admit w/ altered mental status, confusion, and tx for UTI.  MD has noted FTT.  Months back patient had some behavioral issues and had difficulty recognizing people including family members.  She was then put on Depakote, Aricept and risperidone after having consultation with neurology.  As per the family, patient has not been doing too well.  Recently she has been more confused; they state at her LTC facility she walks for days at a time until she literally falls asleep at  th table eating. The daughters tell me her appetite has decreased substantially where she does not really eat or drink, except for sweets, and only small amounts.   CXR: No acute cardiopulmonary findings. Type of Study: Bedside Swallow Evaluation Previous Swallow Assessment: BSE in 08/2021 Diet Prior to this Study: Thin  liquids;Dysphagia 3 (soft) (previously on a Dys. level 2 w/ thins) Temperature Spikes Noted: No (wbc 6.4) Respiratory Status: Room air History of Recent Intubation: No Behavior/Cognition: Alert;Cooperative;Pleasant mood;Confused;Distractible;Doesn't follow directions;Requires cueing Oral Cavity Assessment: Lesions;Dry (white patches) Oral Care Completed by SLP: Yes Oral Cavity - Dentition: Edentulous Vision:  (n/a) Self-Feeding Abilities: Total assist Patient Positioning: Upright in bed (fidgity) Baseline Vocal Quality: Low vocal intensity (muttered speech) Volitional Cough: Cognitively unable to elicit Volitional Swallow: Unable to elicit    Oral/Motor/Sensory Function Overall Oral Motor/Sensory Function: Within functional limits (for bolus management)   Ice Chips Ice chips: Not tested   Thin Liquid Thin Liquid: Impaired Presentation: Cup;Straw (fed - pinched straw most often; ~3 ozs total) Oral Phase Impairments: Reduced labial seal;Poor awareness of bolus (searching behavior) Oral Phase Functional Implications:  (leakage x2) Pharyngeal  Phase Impairments:  (none) Other Comments: decreased oral prep awareness    Nectar Thick Nectar Thick Liquid: Not tested   Honey Thick Honey Thick Liquid: Not tested   Puree Puree: Impaired Presentation: Spoon (fed; 9 trials) Oral Phase Impairments: Reduced labial seal;Reduced lingual movement/coordination;Impaired mastication;Poor awareness of bolus Oral Phase Functional Implications: Oral residue;Oral holding;Prolonged oral transit Pharyngeal Phase Impairments:  (none) Other Comments: used liquid trial to aid oral awareness/attention/clearing   Solid     Solid: Not tested        Orinda Kenner, MS, CCC-SLP Speech Language Pathologist Rehab Services; [place] [phone] (ascom) Meeka Cartelli 11/04/2021,4:43 PM

## 2021-11-04 NOTE — Assessment & Plan Note (Signed)
Pt is brought for FTT. Was admitted last month for UTI. Pt is nonverbal but ed note says pt was speaking.  Swallow eval. D/W  family about providing ongoing nutrition for patient.

## 2021-11-04 NOTE — Assessment & Plan Note (Signed)
Cont levothyroxine once med rec and pt passes swallow.

## 2021-11-04 NOTE — TOC Initial Note (Addendum)
Transition of Care [facility]) - Initial/Assessment Note    Patient Details  Name: Victoria Wilkerson MRN: [id number] Date of Birth: Jul 11, 1945  Transition of Care [facility]) CM/SW Contact:    Candie Chroman, LCSW Phone Number: 11/04/2021, 9:47 AM  Clinical Narrative: Patient not oriented. Called daughter, introduced role, and explained that discharge planning would be discussed. Daughter said she has been at [facility] for the past 1-2 months for rehab with plan to transition to LTC. Admissions coordinator said she is already a LTC resident. Will follow progress and facilitate return once stable.       4:01 pm: Received secure chat from palliative NP stating that family is interested in the [facility] hosp[facility] facility. Called daughter [name] and confirmed. Explained referral process. Referral has been made.            Expected Discharge Plan: Skilled Nursing Facility Barriers to Discharge: Continued Medical Work up   Patient Goals and CMS Choice     Choice offered to / list presented to : NA  Expected Discharge Plan and Services Expected Discharge Plan: [place] Acute Care Choice: Resumption of Svcs/PTA Provider Living arrangements for the past 2 months: [place]                                      Prior Living Arrangements/Services Living arrangements for the past 2 months: [place] Lives with:: Facility Resident Patient language and need for interpreter reviewed:: Yes Do you feel safe going back to the place where you live?: Yes      Need for Family Participation in Patient Care: Yes (Comment) Care giver support system in place?: Yes (comment)   Criminal Activity/Legal Involvement Pertinent to Current Situation/Hospitalization: No - Comment as needed  Activities of Daily Living      Permission Sought/Granted Permission sought to share information with : Facility Sport and exercise psychologist, Family Supports     Share Information with NAME: Bobbie Pinnix  Permission granted to share info w AGENCY: [facility]  Permission granted to share info w Relationship: Daughter  Permission granted to share info w Contact Information: [phone]  Emotional Assessment Appearance:: Appears stated age Attitude/Demeanor/Rapport: Unable to Assess Affect (typically observed): Unable to Assess Orientation: :  (Disoriented x 4) Alcohol / Substance Use: Not Applicable Psych Involvement: No (comment)  Admission diagnosis:  Bradycardia [R00.1] FTT (failure to thrive) in adult [R62.7] Hypothermia, initial encounter [T68.XXXA] Altered mental status, unspecified altered mental status type [R41.82] Patient Active Problem List   Diagnosis Date Noted   FTT (failure to thrive) in adult 11/04/2021   Hypothyroidism 09/13/2021   Hypertension    Sepsis ([place])    UTI (urinary tract infection)    Dementia with behavioral disturbance [facility])    PCP:  [facility], [place]:   [address] [place] [address] Phone: [phone] Fax: [phone]  [place] [address] [place] [facility] [place] [address] Phone: [phone] Fax: [phone]     Social Determinants of Health (SDOH) Interventions    Readmission Risk Interventions     No data to display

## 2021-11-04 NOTE — Assessment & Plan Note (Signed)
Blood pressure (!) 136/92, pulse 92, temperature 98.1 F (36.7 C), temperature source Rectal, resp. rate 17, SpO2 100 %. BP is controlled we will order PRN hydralazine.

## 2021-11-04 NOTE — Assessment & Plan Note (Signed)
We will cont with home meds once med rec available.  Swallow eval.

## 2021-11-09 LAB — CULTURE, BLOOD (ROUTINE X 2)
Culture: NO GROWTH
Culture: NO GROWTH
Special Requests: ADEQUATE

## 2021-11-29 DEATH — deceased
